# Patient Record
Sex: Male | Born: 1945 | Race: White | Hispanic: No | Marital: Married | Smoking: Former smoker
Health system: Southern US, Community
[De-identification: ages and names within clinical notes are randomized; demographics above are authoritative.]

## PROBLEM LIST (undated history)

## (undated) DIAGNOSIS — S065X9A Traumatic subdural hemorrhage with loss of consciousness of unspecified duration, initial encounter: Secondary | ICD-10-CM

## (undated) DIAGNOSIS — I10 Essential (primary) hypertension: Secondary | ICD-10-CM

## (undated) DIAGNOSIS — J449 Chronic obstructive pulmonary disease, unspecified: Secondary | ICD-10-CM

## (undated) DIAGNOSIS — J45909 Unspecified asthma, uncomplicated: Secondary | ICD-10-CM

## (undated) DIAGNOSIS — I82409 Acute embolism and thrombosis of unspecified deep veins of unspecified lower extremity: Secondary | ICD-10-CM

## (undated) DIAGNOSIS — S065XAA Traumatic subdural hemorrhage with loss of consciousness status unknown, initial encounter: Secondary | ICD-10-CM

## (undated) DIAGNOSIS — E119 Type 2 diabetes mellitus without complications: Secondary | ICD-10-CM

## (undated) DIAGNOSIS — I480 Paroxysmal atrial fibrillation: Secondary | ICD-10-CM

## (undated) HISTORY — PX: IVC FILTER PLACEMENT (ARMC HX): HXRAD1551

## (undated) HISTORY — PX: CORONARY STENT PLACEMENT: SHX1402

---

## 2006-06-13 ENCOUNTER — Emergency Department: Payer: Self-pay | Admitting: Emergency Medicine

## 2015-05-31 ENCOUNTER — Inpatient Hospital Stay
Admission: EM | Admit: 2015-05-31 | Discharge: 2015-06-05 | DRG: 193 | Disposition: A | Discharge status: Home Health | Payer: Non-veteran care | Attending: Internal Medicine | Admitting: Internal Medicine

## 2015-05-31 ENCOUNTER — Emergency Department: Payer: Non-veteran care

## 2015-05-31 ENCOUNTER — Encounter: Payer: Self-pay | Admitting: Emergency Medicine

## 2015-05-31 DIAGNOSIS — Z86718 Personal history of other venous thrombosis and embolism: Secondary | ICD-10-CM | POA: Diagnosis not present

## 2015-05-31 DIAGNOSIS — R700 Elevated erythrocyte sedimentation rate: Secondary | ICD-10-CM | POA: Diagnosis present

## 2015-05-31 DIAGNOSIS — D638 Anemia in other chronic diseases classified elsewhere: Secondary | ICD-10-CM | POA: Diagnosis present

## 2015-05-31 DIAGNOSIS — R55 Syncope and collapse: Secondary | ICD-10-CM | POA: Diagnosis present

## 2015-05-31 DIAGNOSIS — Z79899 Other long term (current) drug therapy: Secondary | ICD-10-CM

## 2015-05-31 DIAGNOSIS — E119 Type 2 diabetes mellitus without complications: Secondary | ICD-10-CM | POA: Diagnosis present

## 2015-05-31 DIAGNOSIS — Y95 Nosocomial condition: Secondary | ICD-10-CM | POA: Diagnosis present

## 2015-05-31 DIAGNOSIS — R410 Disorientation, unspecified: Secondary | ICD-10-CM

## 2015-05-31 DIAGNOSIS — G9340 Encephalopathy, unspecified: Secondary | ICD-10-CM | POA: Diagnosis present

## 2015-05-31 DIAGNOSIS — R609 Edema, unspecified: Secondary | ICD-10-CM

## 2015-05-31 DIAGNOSIS — I959 Hypotension, unspecified: Secondary | ICD-10-CM | POA: Diagnosis present

## 2015-05-31 DIAGNOSIS — D649 Anemia, unspecified: Secondary | ICD-10-CM | POA: Diagnosis present

## 2015-05-31 DIAGNOSIS — J440 Chronic obstructive pulmonary disease with acute lower respiratory infection: Secondary | ICD-10-CM | POA: Diagnosis present

## 2015-05-31 DIAGNOSIS — N179 Acute kidney failure, unspecified: Secondary | ICD-10-CM | POA: Diagnosis present

## 2015-05-31 DIAGNOSIS — S065X9A Traumatic subdural hemorrhage with loss of consciousness of unspecified duration, initial encounter: Secondary | ICD-10-CM | POA: Diagnosis present

## 2015-05-31 DIAGNOSIS — I10 Essential (primary) hypertension: Secondary | ICD-10-CM | POA: Diagnosis present

## 2015-05-31 DIAGNOSIS — Y92009 Unspecified place in unspecified non-institutional (private) residence as the place of occurrence of the external cause: Secondary | ICD-10-CM | POA: Diagnosis not present

## 2015-05-31 DIAGNOSIS — I82409 Acute embolism and thrombosis of unspecified deep veins of unspecified lower extremity: Secondary | ICD-10-CM | POA: Diagnosis not present

## 2015-05-31 DIAGNOSIS — I480 Paroxysmal atrial fibrillation: Secondary | ICD-10-CM | POA: Diagnosis present

## 2015-05-31 DIAGNOSIS — E860 Dehydration: Secondary | ICD-10-CM | POA: Diagnosis present

## 2015-05-31 DIAGNOSIS — Z87891 Personal history of nicotine dependence: Secondary | ICD-10-CM

## 2015-05-31 DIAGNOSIS — Z955 Presence of coronary angioplasty implant and graft: Secondary | ICD-10-CM | POA: Diagnosis not present

## 2015-05-31 DIAGNOSIS — J189 Pneumonia, unspecified organism: Secondary | ICD-10-CM | POA: Diagnosis not present

## 2015-05-31 DIAGNOSIS — W19XXXA Unspecified fall, initial encounter: Secondary | ICD-10-CM | POA: Diagnosis present

## 2015-05-31 HISTORY — DX: Type 2 diabetes mellitus without complications: E11.9

## 2015-05-31 HISTORY — DX: Paroxysmal atrial fibrillation: I48.0

## 2015-05-31 HISTORY — DX: Acute embolism and thrombosis of unspecified deep veins of unspecified lower extremity: I82.409

## 2015-05-31 HISTORY — DX: Traumatic subdural hemorrhage with loss of consciousness of unspecified duration, initial encounter: S06.5X9A

## 2015-05-31 HISTORY — DX: Chronic obstructive pulmonary disease, unspecified: J44.9

## 2015-05-31 HISTORY — DX: Essential (primary) hypertension: I10

## 2015-05-31 HISTORY — DX: Traumatic subdural hemorrhage with loss of consciousness status unknown, initial encounter: S06.5XAA

## 2015-05-31 LAB — URINALYSIS COMPLETE WITH MICROSCOPIC (ARMC ONLY)
Bilirubin Urine: NEGATIVE
GLUCOSE, UA: NEGATIVE mg/dL
HGB URINE DIPSTICK: NEGATIVE
Ketones, ur: NEGATIVE mg/dL
LEUKOCYTES UA: NEGATIVE
Nitrite: NEGATIVE
PH: 5 (ref 5.0–8.0)
PROTEIN: NEGATIVE mg/dL
SPECIFIC GRAVITY, URINE: 1.014 (ref 1.005–1.030)

## 2015-05-31 LAB — CBC
HEMATOCRIT: 26.4 % — AB (ref 40.0–52.0)
HEMOGLOBIN: 8.7 g/dL — AB (ref 13.0–18.0)
MCH: 29.9 pg (ref 26.0–34.0)
MCHC: 33.1 g/dL (ref 32.0–36.0)
MCV: 90.3 fL (ref 80.0–100.0)
Platelets: 469 10*3/uL — ABNORMAL HIGH (ref 150–440)
RBC: 2.92 MIL/uL — AB (ref 4.40–5.90)
RDW: 14.7 % — ABNORMAL HIGH (ref 11.5–14.5)
WBC: 16.5 10*3/uL — AB (ref 3.8–10.6)

## 2015-05-31 LAB — BASIC METABOLIC PANEL
Anion gap: 10 (ref 5–15)
BUN: 31 mg/dL — ABNORMAL HIGH (ref 6–20)
CHLORIDE: 104 mmol/L (ref 101–111)
CO2: 23 mmol/L (ref 22–32)
Calcium: 8.5 mg/dL — ABNORMAL LOW (ref 8.9–10.3)
Creatinine, Ser: 1.9 mg/dL — ABNORMAL HIGH (ref 0.61–1.24)
GFR calc non Af Amer: 35 mL/min — ABNORMAL LOW (ref >60)
GFR, EST AFRICAN AMERICAN: 40 mL/min — AB (ref >60)
Glucose, Bld: 112 mg/dL — ABNORMAL HIGH (ref 65–99)
POTASSIUM: 4.7 mmol/L (ref 3.5–5.1)
SODIUM: 137 mmol/L (ref 135–145)

## 2015-05-31 LAB — GLUCOSE, CAPILLARY: GLUCOSE-CAPILLARY: 112 mg/dL — AB (ref 65–99)

## 2015-05-31 MED ORDER — ACETAMINOPHEN 325 MG PO TABS: 650.0000 mg | ORAL_TABLET | Freq: Four times a day (QID) | ORAL | Status: DC | PRN

## 2015-05-31 MED ORDER — ONDANSETRON HCL 4 MG PO TABS: 4.0000 mg | ORAL_TABLET | Freq: Four times a day (QID) | ORAL | Status: DC | PRN

## 2015-05-31 MED ORDER — PIPERACILLIN-TAZOBACTAM 3.375 G IVPB
3.3750 g | Freq: Once | INTRAVENOUS | Status: AC
  Administered 2015-05-31: 3.375 g via INTRAVENOUS
  Filled 2015-05-31: qty 50

## 2015-05-31 MED ORDER — OXYCODONE HCL 5 MG PO TABS: 5.0000 mg | ORAL_TABLET | ORAL | Status: DC | PRN

## 2015-05-31 MED ORDER — SODIUM CHLORIDE 0.9 % IJ SOLN
3.0000 mL | Freq: Two times a day (BID) | INTRAMUSCULAR | Status: DC
  Administered 2015-06-01 – 2015-06-05 (×8): 3 mL via INTRAVENOUS

## 2015-05-31 MED ORDER — ACETAMINOPHEN 650 MG RE SUPP: 650.0000 mg | Freq: Four times a day (QID) | RECTAL | Status: DC | PRN

## 2015-05-31 MED ORDER — SODIUM CHLORIDE 0.9 % IV BOLUS (SEPSIS)
1000.0000 mL | Freq: Once | INTRAVENOUS | Status: AC
Start: 2015-05-31 — End: 2015-05-31
  Administered 2015-05-31: 1000 mL via INTRAVENOUS

## 2015-05-31 MED ORDER — SODIUM CHLORIDE 0.9 % IV SOLN
INTRAVENOUS | Status: DC
  Administered 2015-06-01 (×2): via INTRAVENOUS

## 2015-05-31 MED ORDER — VANCOMYCIN HCL IN DEXTROSE 1-5 GM/200ML-% IV SOLN
1000.0000 mg | Freq: Once | INTRAVENOUS | Status: AC
Start: 2015-05-31 — End: 2015-06-04
  Administered 2015-05-31: 1000 mg via INTRAVENOUS
  Filled 2015-05-31: qty 200

## 2015-05-31 MED ORDER — MORPHINE SULFATE (PF) 2 MG/ML IV SOLN
2.0000 mg | INTRAVENOUS | Status: DC | PRN
  Administered 2015-06-01: 2 mg via INTRAVENOUS
  Filled 2015-05-31 (×2): qty 1

## 2015-05-31 MED ORDER — ONDANSETRON HCL 4 MG/2ML IJ SOLN: 4.0000 mg | Freq: Four times a day (QID) | INTRAMUSCULAR | Status: DC | PRN

## 2015-05-31 NOTE — ED Notes (Signed)
Pt arrived by EMS after being picked up from home. EMS states pt was at [facility] today for stent [facility] and was not "happy" with the medicines he was receiving and signed out AMA. EMS stated that upon arrival home pt got out of the car and lost consciousness and hit his head and defecated on himself. EMS also stated that pt was to be evaluated for possible stroke and it was not confirmed but did notice that his stool was dark. Pt has hx of COPD, HTN, TIAs and diabetes. Upon EMS arrival  BP was 78/40. Pt was given 500cc bolus by EMS and upon reassessment BP was 113/47.

## 2015-05-31 NOTE — H&P (Signed)
[facility] [place] [facility]   PATIENT NAME: Thomas [name]    MR#:  [id number]  DATE OF BIRTH:  07/19/1945   DATE OF ADMISSION:  05/31/2015  PRIMARY CARE PHYSICIAN: No PCP Per Patient   REQUESTING/REFERRING PHYSICIAN: Lord  CHIEF COMPLAINT:   Chief Complaint  Patient presents with  . Loss of Consciousness    HISTORY OF PRESENT ILLNESS:  Thomas [name]  is a 69 y.o. male with a known history of recent DVT status post IVC filter placements, who is presenting after syncopal episode. The patient is unable to provide meaningful information given mental status/medical condition majority of history Obtained from ED provider as well as prior documentation. Patient recently finished an extensive course in [facility] admitted 05/12/2015 after her fall course complicated by aspiration pneumonia, he required intubation/mechanical ventilation-completed full course of antibiotics. He is also found to have a DVT, subdural hematoma, an IVC filter placed. He was transferred out of the intensive care 05/30/2015 where he was noted to have altered mental status described as confusion. Despite this the patient and family decided to leave AGAINST MEDICAL ADVICE 05/31/2015. While at home at syncopal episode from standing with head trauma. On EMS arrival noted to be hypotensive systolic blood pressure in the 70s. When asked the patient how he is doing he states "I'm a human being and pretty happy about that" followed by incoherent babble.  PAST MEDICAL HISTORY:   Past Medical History  Diagnosis Date  . COPD (chronic obstructive pulmonary disease) (HCC)   . Diabetes mellitus without complication (HCC)   . Hypertension   . DVT (deep venous thrombosis) (HCC)   . Subdural hematoma (HCC)     PAST SURGICAL HISTORY:   Past Surgical History  Procedure Laterality Date  . Coronary stent placement    . Ivc filter placement ([facility] hx)      SOCIAL HISTORY:   Social History   Substance Use Topics  . Smoking status: Former Games developer  . Smokeless tobacco: Not on file  . Alcohol Use: No    FAMILY HISTORY:   Family History  Problem Relation Age of Onset  . Hypertension Other     DRUG ALLERGIES:  No Known Allergies  REVIEW OF SYSTEMS:  Unable to obtain given mental status/medical condition   MEDICATIONS AT HOME:   Prior to Admission medications   Medication Sig Start Date End Date Taking? Authorizing Provider  albuterol (PROVENTIL HFA;VENTOLIN HFA) 108 (90 BASE) MCG/ACT inhaler Inhale 2 puffs into the lungs every 6 (six) hours as needed for wheezing or shortness of breath.   Yes Historical Provider, MD  carvedilol (COREG) 6.25 MG tablet Take 6.25 mg by mouth 2 (two) times daily.   Yes Historical Provider, MD  diltiazem (CARDIZEM CD) 360 MG 24 hr capsule Take 360 mg by mouth daily.   Yes Historical Provider, MD  gabapentin (NEURONTIN) 300 MG capsule Take 300 mg by mouth 3 (three) times daily.   Yes Historical Provider, MD  Melatonin 3 MG TABS Take 3 mg by mouth at bedtime as needed (for sleep).   Yes Historical Provider, MD  Multiple Vitamin (MULTIVITAMIN WITH MINERALS) TABS tablet Take 1 tablet by mouth daily.   Yes Historical Provider, MD  niacin 100 MG tablet Take 300 mg by mouth 3 (three) times daily with meals.   Yes Historical Provider, MD  omega-3 acid ethyl esters (LOVAZA) 1 G capsule Take 2 g by mouth 2 (two) times daily.   Yes Historical Provider,  MD  potassium chloride SA (K-DUR,KLOR-CON) 20 MEQ tablet Take 20 mEq by mouth daily.   Yes Historical Provider, MD  pravastatin (PRAVACHOL) 40 MG tablet Take 40 mg by mouth at bedtime.   Yes Historical Provider, MD  Pyridoxine HCl (VITAMIN B-6) 500 MG tablet Take 1,000 mg by mouth daily.   Yes Historical Provider, MD  thiamine (VITAMIN B-1) 100 MG tablet Take 100 mg by mouth daily.   Yes Historical Provider, MD  vitamin B-12 (CYANOCOBALAMIN) 1000 MCG tablet Take 1,000 mcg by mouth daily.   Yes Historical  Provider, MD  Vitamin D-Vitamin K (VITAMIN K2-VITAMIN D3) 45-2000 MCG-UNIT CAPS Take 1 capsule by mouth daily.   Yes Historical Provider, MD      VITAL SIGNS:  Blood pressure 127/60, pulse 74, temperature 97.5 F (36.4 C), temperature source Oral, resp. rate 16, SpO2 94 %.  PHYSICAL EXAMINATION:  VITAL SIGNS: Filed Vitals:   05/31/15 1930  BP: 127/60  Pulse:   Temp:   Resp: 16   GENERAL:68 y.o.male currently in moderate acute distress given mental status.  HEAD: Normocephalic, atraumatic.  EYES: Pupils equal, round, reactive to light. Extraocular muscles intact. No scleral icterus.  MOUTH: Moist mucosal membrane. Dentition intact. No abscess noted.  EAR, NOSE, THROAT: Clear without exudates. No external lesions.  NECK: Supple. No thyromegaly. No nodules. No JVD.  PULMONARY: Diffuse coarse breath sounds with scattered rhonchi  without wheeze . No use of accessory muscles, Good respiratory effort. good air entry bilaterally CHEST: Nontender to palpation.  CARDIOVASCULAR: S1 and S2. Regular rate and rhythm. No murmurs, rubs, or gallops. No edema. Pedal pulses 2+ bilaterally.  GASTROINTESTINAL: Soft, nontender, nondistended. No masses. Positive bowel sounds. No hepatosplenomegaly.  MUSCULOSKELETAL: No swelling, clubbing, or edema. Range of motion full in all extremities.  NEUROLOGIC: Difficult to fully assess given patient's mental status Cranial nerves II through XII are intact. No gross focal neurological deficits. Sensation intact. Reflexes intact.  SKIN: No ulceration, lesions, rashes, or cyanosis. Skin warm and dry. Turgor intact.  PSYCHIATRIC: Mood, affect blunted. The patient is awake, alert and oriented x self. Insight, judgment poor.    LABORATORY PANEL:   CBC  Recent Labs Lab 05/31/15 1748  WBC 16.5*  HGB 8.7*  HCT 26.4*  PLT 469*   ------------------------------------------------------------------------------------------------------------------  Chemistries    Recent Labs Lab 05/31/15 1748  NA 137  K 4.7  CL 104  CO2 23  GLUCOSE 112*  BUN 31*  CREATININE 1.90*  CALCIUM 8.5*   ------------------------------------------------------------------------------------------------------------------  Cardiac Enzymes No results for input(s): TROPONINI in the last 168 hours. ------------------------------------------------------------------------------------------------------------------  RADIOLOGY:  Ct Head Wo Contrast  05/31/2015  CLINICAL DATA:  Pt arrived by EMS after being picked up from home. EMS states [facility] and was not "happy" with the medicines he was receiving and signed out AMA. EMS stated that upon arrival home pt got out of the.*comment was truncated* EXAM: CT HEAD WITHOUT CONTRAST TECHNIQUE: Contiguous axial images were obtained from the base of the skull through the vertex without intravenous contrast. COMPARISON:  None FINDINGS: Atherosclerotic and physiologic intracranial calcifications. Mild atrophy. There is no evidence of acute intracranial hemorrhage, brain edema, mass lesion, acute infarction, mass effect, or midline shift. Acute infarct may be inapparent on noncontrast CT. No other intra-axial abnormalities are seen, and the ventricles and sulci are within normal limits in size and symmetry. No abnormal extra-axial fluid collections or masses are identified. No significant calvarial abnormality. IMPRESSION: 1. Negative for  bleed or other acute intracranial process. Electronically Signed   By: Corlis Leak M.D.   On: 05/31/2015 19:30   Dg Chest Port 1 View  05/31/2015  CLINICAL DATA:  Patient history of bronchitis and pneumonia. Syncopal episode. EXAM: PORTABLE CHEST 1 VIEW COMPARISON:  None. FINDINGS: Limited portable chest radiograph. Enlarged cardiac and mediastinal contours. Heterogeneous opacities within the lower lungs bilaterally. Linear opacity right mid lung. No large pleural effusion or  pneumothorax. IMPRESSION: Limited exam due to portable technique. Heterogeneous opacities within the lung bases bilaterally favored to represent atelectasis. Linear opacity within the right midlung is nonspecific and represent atelectasis or scarring. Recommend comparison with outside prior exams or repeat PA and lateral chest radiograph for further evaluation. Electronically Signed   By: Annia Belt M.D.   On: 05/31/2015 21:30    EKG:   Orders placed or performed during the hospital encounter of 05/31/15  . ED EKG  . ED EKG  . EKG 12-Lead  . EKG 12-Lead    IMPRESSION AND PLAN:   68 year old Caucasian gentleman history of COPD unspecified reason extended course at [facility] requiring intubation secondary to aspiration pneumonia, also noted subdural hematoma and DVT who AMA from [facility] just today who is presenting after his syncopal episode.  1. Syncope: Telemetry, IV fluid hydration, troponin 2. Healthcare associated pneumonia: As indicated by worsening leukocytosis, chest x-ray findings somewhat worse than [facility] findings already received antibiotics vancomycin/Zosyn and continue with his follow culture data breathing treatments and oxygen. 3. Acute kidney injury: Worsening renal function since last checked [facility] IV fluid hydration and follow renal function/urinary output. Of note had acute kidney injury on last hospitalization creatinine maximum at 3 however improved prior to AMA 4. Essential hypertension: Coreg 5. Paroxysmal atrial fibrillation: Continue with Cardizem hold on anticoagulation given recent subdural hematoma 6. Venous thromboembolism prophylactic: SCDs    All the records are reviewed and case discussed with ED provider. Management plans discussed with the patient, family and they are in agreement.  CODE STATUS: Full  TOTAL TIME TAKING CARE OF THIS PATIENT: 55 minutes.    Magdala Brahmbhatt,  Mardi Mainland.D on 05/31/2015 at 11:49 PM  Between 7am to 6pm - Pager - [phone]  After 6pm: House  Pager: - [phone]  [facility]  Office  [phone]  CC: Primary care physician; No PCP Per Patient

## 2015-05-31 NOTE — ED Notes (Signed)
Family at bedside. Pt is alert but not making complete sense when he talks.

## 2015-05-31 NOTE — ED Provider Notes (Signed)
[facility] Emergency Department Provider Note   ____________________________________________  Time seen:  I have reviewed the triage vital signs and the triage nursing note.  HISTORY  Chief Complaint Loss of Consciousness   Historian Limited,Patientpoor historian History per daughter and wife once they arrived  HPI Thomas [name] is a 69 y.o. male who was recently in the hospital at [facility] for respiratory failure, status post intubation/extubation, status post atrial fibrillation with rapid ventricular response, found to have a subdural hematoma on xarelto (which was stopped and IVC filter placed).  Also had generalized deconditioning, intermittent delirium throughout the hospital stay.  He left  [facility] today AGAINST MEDICAL ADVICE. Patient states that "they didn't have my best interest in mind "and that they were just doing procedures to "make money." Patient states he was ready to go home. He states he had all medicines he needed at home.  At home patient states he felt lightheaded and passed out and struck his head. Reportedly per EMS the patient's initial blood pressure was low at 78/40 and improved after 500 cc bolus by EMS. Patient is currently not complaining of any pain including no headache, neck pain, chest pain, extremity pain, or abdominal pain.  Family states they thought patient might be low to do okay at home, but he can't walk due to weakness, and he has altered mental status. He has been paranoid.    Past Medical History  Diagnosis Date  . COPD (chronic obstructive pulmonary disease) (HCC)   . Diabetes mellitus without complication (HCC)   . Hypertension     There are no active problems to display for this patient.   Past Surgical History  Procedure Laterality Date  . Coronary stent placement      No current outpatient prescriptions on file.  Allergies Review of patient's allergies indicates no known allergies.  History reviewed. No  pertinent family history.  Social History Social History  Substance Use Topics  . Smoking status: Former Games developer  . Smokeless tobacco: None  . Alcohol Use: No    Review of Systems  Constitutional: Negative for fever. Eyes: Chronic left eyelid droop.. ENT: Negative for sore throat. Cardiovascular: Negative for chest pain. Respiratory: Negative for shortness of breath. Gastrointestinal: Negative for abdominal pain, vomiting and diarrhea. Genitourinary: Negative for dysuria. Musculoskeletal: Negative for back pain. Skin: Negative for rash. Neurological: Negative for headache. 10 point Review of Systems otherwise negative ____________________________________________   PHYSICAL EXAM:  VITAL SIGNS: ED Triage Vitals  Enc Vitals Group     BP 05/31/15 1715 102/79 mmHg     Pulse Rate 05/31/15 1715 75     Resp 05/31/15 1739 23     Temp 05/31/15 1739 97.5 F (36.4 C)     Temp Source 05/31/15 1739 Oral     SpO2 05/31/15 1739 97 %     Weight --      Height --      Head Cir --      Peak Flow --      Pain Score 05/31/15 1738 0     Pain Loc --      Pain Edu? --      Excl. in GC? --      Constitutional: Alert and disoriented. Some stuttering with his speech. In no acute distress. Eyes: Conjunctivae are normal. PERRL. left eyelid lid lag ENT   Head: Normocephalic and atraumatic.   Nose: No congestion/rhinnorhea.   Mouth/Throat: Mucous membranes are mildly dry.   Neck: No stridor.  No midline C-spine tenderness to palpation. Cardiovascular/Chest: Normal rate, regular rhythm.  No murmurs, rubs, or gallops. Respiratory: Normal respiratory effort without tachypnea nor retractions. Breath sounds are clear and equal bilaterally. No wheezes/rales/rhonchi. Gastrointestinal: Soft. No distention, no guarding, no rebound. Nontender. Obese.  Genitourinary/rectal:Deferred Musculoskeletal: Nontender with normal range of motion in all extremities. No joint effusions.  No lower  extremity tenderness.  Mild lower extremity edema bilaterally. Neurologic:  Some stuttering with his speech. Occasional interchanging incorrect word. No focal weakness or numbness. Oriented 2.somewhat paranoid about hospital "just doing tested get my money" Skin:  Skin is warm, dry and intact. No rash noted.   ____________________________________________   EKG I, Governor Rooksebecca Renea Schoonmaker, MD, the attending physician have personally viewed and interpreted all ECGs.  73 bpm. Normal sinus rhythm. Narrow QRS. Normal axis. Nonspecific T wave. ____________________________________________  LABS (pertinent positives/negatives)  Urinalysis negative for ketones, urinary tract infection, blood Basic metabolic panel significant for BUN 31 and creatinine 1.90 White blood cell count 16.5, hemoglobin 8.7 and platelet count 469  ____________________________________________  RADIOLOGY All Xrays were viewed by me. Imaging interpreted by Radiologist.  CT head noncontrast: negative for bleed or other acute intracranial process  Chest x-ray portable:  IMPRESSION: Limited exam due to portable technique.  Heterogeneous opacities within the lung bases bilaterally favored to represent atelectasis.  Linear opacity within the right midlung is nonspecific and represent atelectasis or scarring.  Recommend comparison with outside prior exams or repeat PA and lateral chest radiograph for further evaluation. __________________________________________  PROCEDURES  Procedure(s) performed: None  Critical Care performed: None  ____________________________________________   ED COURSE / ASSESSMENT AND PLAN  CONSULTATIONS: hospitalist for admission  Pertinent labs & imaging results that were available during my care of the patient were reviewed by me and considered in my medical decision making (see chart for details).  [facility] summary and note laying out his  concern about the patient leaving AGAINST MEDICAL ADVICE due to delirium, generalized deconditioning and fall risk.  Once family arrived they confirm the patient has not had his mental status baseline. They did try taking him home, but he is not listening, is not redirectable, is unsafe walking into weak to walk. They feel like he does need admission to rehabilitation.  His white blood count was found to be elevated, and this is up from yesterday's labs which were sent by fax, from 10 to today's 16. This prompted me to add a chest x-ray, and on my visualization it looks like he has basilar infiltrates. This may be Hospital/healthcare acquired pneumonia, patient will be treated for appropriate coverage including vancomycin and Zosyn after blood cultures.   Patient / Family / Caregiver informed of clinical course, medical decision-making process, and agree with plan.   ___________________________________________   FINAL CLINICAL IMPRESSION(S) / ED DIAGNOSES   Final diagnoses:  Syncope, unspecified syncope type  Dehydration  Acute renal failure, unspecified acute renal failure type (HCC)  Bilateral pneumonia       Governor Rooksebecca Paxtyn Boyar, MD 05/31/15 2205

## 2015-06-01 LAB — TROPONIN I
Troponin I: <0.03 ng/mL (ref <0.031)
Troponin I: <0.03 ng/mL (ref <0.031)

## 2015-06-01 LAB — VANCOMYCIN, RANDOM

## 2015-06-01 MED ORDER — PRAVASTATIN SODIUM 40 MG PO TABS
40.0000 mg | ORAL_TABLET | Freq: Every day | ORAL | Status: DC
  Administered 2015-06-01 – 2015-06-04 (×4): 40 mg via ORAL
  Filled 2015-06-01 (×4): qty 1

## 2015-06-01 MED ORDER — VITAMIN B-6 100 MG PO TABS: 1000.0000 mg | ORAL_TABLET | Freq: Every day | ORAL | Status: DC

## 2015-06-01 MED ORDER — PIPERACILLIN-TAZOBACTAM 4.5 G IVPB
4.5000 g | Freq: Three times a day (TID) | INTRAVENOUS | Status: DC
Start: 2015-06-01 — End: 2015-06-04
  Administered 2015-06-01 – 2015-06-04 (×10): 4.5 g via INTRAVENOUS
  Filled 2015-06-01 (×14): qty 100

## 2015-06-01 MED ORDER — NIACIN 500 MG PO TABS
250.0000 mg | ORAL_TABLET | Freq: Three times a day (TID) | ORAL | Status: DC
  Administered 2015-06-01 – 2015-06-04 (×9): 250 mg via ORAL
  Administered 2015-06-04: 14:00:00 via ORAL
  Administered 2015-06-05: 09:00:00 250 mg via ORAL
  Filled 2015-06-01 (×16): qty 1

## 2015-06-01 MED ORDER — VITAMIN K2-VITAMIN D3 45-2000 MCG-UNIT PO CAPS: 1.0000 | ORAL_CAPSULE | Freq: Every day | ORAL | Status: DC

## 2015-06-01 MED ORDER — NIACIN 50 MG PO TABS: 300.0000 mg | ORAL_TABLET | Freq: Three times a day (TID) | ORAL | Status: DC

## 2015-06-01 MED ORDER — CARVEDILOL 3.125 MG PO TABS
6.2500 mg | ORAL_TABLET | Freq: Two times a day (BID) | ORAL | Status: DC
  Administered 2015-06-01 – 2015-06-05 (×9): 6.25 mg via ORAL
  Filled 2015-06-01 (×3): qty 1
  Filled 2015-06-01: qty 2
  Filled 2015-06-01 (×4): qty 1
  Filled 2015-06-01 (×2): qty 2

## 2015-06-01 MED ORDER — VITAMIN D 1000 UNITS PO TABS
2000.0000 [IU] | ORAL_TABLET | Freq: Every day | ORAL | Status: DC
  Administered 2015-06-01 – 2015-06-05 (×5): 2000 [IU] via ORAL
  Filled 2015-06-01 (×5): qty 2

## 2015-06-01 MED ORDER — VITAMIN B-1 100 MG PO TABS
100.0000 mg | ORAL_TABLET | Freq: Every day | ORAL | Status: DC
  Administered 2015-06-01 – 2015-06-02 (×3): 100 mg via ORAL
  Filled 2015-06-01 (×2): qty 1

## 2015-06-01 MED ORDER — IPRATROPIUM-ALBUTEROL 0.5-2.5 (3) MG/3ML IN SOLN: 3.0000 mL | RESPIRATORY_TRACT | Status: DC | PRN

## 2015-06-01 MED ORDER — GABAPENTIN 300 MG PO CAPS
300.0000 mg | ORAL_CAPSULE | Freq: Three times a day (TID) | ORAL | Status: DC
  Administered 2015-06-01 – 2015-06-05 (×13): 300 mg via ORAL
  Filled 2015-06-01 (×13): qty 1

## 2015-06-01 MED ORDER — OMEGA-3-ACID ETHYL ESTERS 1 G PO CAPS
2.0000 g | ORAL_CAPSULE | Freq: Two times a day (BID) | ORAL | Status: DC
  Administered 2015-06-01 – 2015-06-05 (×9): 2 g via ORAL
  Filled 2015-06-01 (×9): qty 2

## 2015-06-01 MED ORDER — ADULT MULTIVITAMIN W/MINERALS CH
1.0000 | ORAL_TABLET | Freq: Every day | ORAL | Status: DC
  Administered 2015-06-01 – 2015-06-05 (×5): 1 via ORAL
  Filled 2015-06-01 (×5): qty 1

## 2015-06-01 MED ORDER — SODIUM CHLORIDE 0.9 % IV SOLN
1500.0000 mg | INTRAVENOUS | Status: DC
  Administered 2015-06-01 – 2015-06-03 (×4): 1500 mg via INTRAVENOUS
  Filled 2015-06-01 (×5): qty 1500

## 2015-06-01 MED ORDER — VITAMIN B-12 1000 MCG PO TABS
1000.0000 ug | ORAL_TABLET | Freq: Every day | ORAL | Status: DC
  Administered 2015-06-01 – 2015-06-05 (×5): 1000 ug via ORAL
  Filled 2015-06-01 (×5): qty 1

## 2015-06-01 MED ORDER — DILTIAZEM HCL ER COATED BEADS 180 MG PO CP24
360.0000 mg | ORAL_CAPSULE | Freq: Every day | ORAL | Status: DC
  Administered 2015-06-01: 180 mg via ORAL
  Administered 2015-06-02 – 2015-06-05 (×3): 360 mg via ORAL
  Filled 2015-06-01 (×5): qty 2

## 2015-06-01 NOTE — Progress Notes (Signed)
Pt was transferred to room 251. Pt is settled in bed resting comfortably.

## 2015-06-01 NOTE — Progress Notes (Signed)
[facility] - [place] at [facility]   PATIENT NAME: Thomas [name]    MR#:  [id number]  DATE OF BIRTH:  25-Feb-1946  SUBJECTIVE:  CHIEF COMPLAINT:   Chief Complaint  Patient presents with  . Loss of Consciousness   Alert. Will not answer any questions regarding orientation. Denies any symptoms.  REVIEW OF SYSTEMS:   ROS unable to obtain as patient will not answer any questions  DRUG ALLERGIES:  No Known Allergies  VITALS:  Blood pressure 113/56, pulse 65, temperature 97.7 F (36.5 C), temperature source Oral, resp. rate 20, height  (1.854 m), weight 124.467 kg (274 lb 6.4 oz), SpO2 98 %.  PHYSICAL EXAMINATION:  GENERAL:  69 y.o.-year-old patient lying in the bed with no acute distress.  EYES: Pupils equal, round, reactive to light and accommodation. No scleral icterus. Extraocular muscles intact.  HEENT: Head atraumatic, normocephalic. Oropharynx and nasopharynx clear.  NECK:  Supple, no jugular venous distention. No thyroid enlargement, no tenderness.  LUNGS: Normal breath sounds bilaterally, no wheezing, rales,rhonchi or crepitation. No use of accessory muscles of respiration.  CARDIOVASCULAR: S1, S2 normal. No murmurs, rubs, or gallops.  ABDOMEN: Soft, nontender, distended. Bowel sounds present. No organomegaly or mass.  EXTREMITIES: No pedal edema, cyanosis, or clubbing.  NEUROLOGIC: Left-sided facial droop with twitching, moves all 4 extremities spontaneously, speech is normal though nonsensical, will not participate in neurologic examination PSYCHIATRIC: The patient is alert, will not answer questions states that he does not want to SKIN: Abrasion over the scalp, left hand, left arm   LABORATORY PANEL:   CBC  Recent Labs Lab 05/31/15 1748  WBC 16.5*  HGB 8.7*  HCT 26.4*  PLT 469*   ------------------------------------------------------------------------------------------------------------------  Chemistries   Recent Labs Lab  05/31/15 1748  NA 137  K 4.7  CL 104  CO2 23  GLUCOSE 112*  BUN 31*  CREATININE 1.90*  CALCIUM 8.5*   ------------------------------------------------------------------------------------------------------------------  Cardiac Enzymes  Recent Labs Lab 06/01/15 1336  TROPONINI <0.03   ------------------------------------------------------------------------------------------------------------------  RADIOLOGY:  Ct Head Wo Contrast  05/31/2015  CLINICAL DATA:  Pt arrived by EMS after being picked up from home. EMS states pt was at [facility] today for stent placement and was not "happy" with the medicines he was receiving and signed out AMA. EMS stated that upon arrival home pt got out of the.*comment was truncated* EXAM: CT HEAD WITHOUT CONTRAST TECHNIQUE: Contiguous axial images were obtained from the base of the skull through the vertex without intravenous contrast. COMPARISON:  None FINDINGS: Atherosclerotic and physiologic intracranial calcifications. Mild atrophy. There is no evidence of acute intracranial hemorrhage, brain edema, mass lesion, acute infarction, mass effect, or midline shift. Acute infarct may be inapparent on noncontrast CT. No other intra-axial abnormalities are seen, and the ventricles and sulci are within normal limits in size and symmetry. No abnormal extra-axial fluid collections or masses are identified. No significant calvarial abnormality. IMPRESSION: 1. Negative for bleed or other acute intracranial process. Electronically Signed   By: Corlis Leak M.D.   On: 05/31/2015 19:30   Dg Chest Port 1 View  05/31/2015  CLINICAL DATA:  Patient history of bronchitis and pneumonia. Syncopal episode. EXAM: PORTABLE CHEST 1 VIEW COMPARISON:  None. FINDINGS: Limited portable chest radiograph. Enlarged cardiac and mediastinal contours. Heterogeneous opacities within the lower lungs bilaterally. Linear opacity right mid lung. No large pleural effusion or pneumothorax. IMPRESSION:  Limited exam due to portable technique. Heterogeneous opacities within the lung bases bilaterally favored to represent atelectasis.  Linear opacity within the right midlung is nonspecific and represent atelectasis or scarring. Recommend comparison with outside prior exams or repeat PA and lateral chest radiograph for further evaluation. Electronically Signed   By: Annia Beltrew  Davis M.D.   On: 05/31/2015 21:30    EKG:   Orders placed or performed during the hospital encounter of 05/31/15  . ED EKG  . ED EKG  . EKG 12-Lead  . EKG 12-Lead    ASSESSMENT AND PLAN:    1. Syncope: Likely related to pneumonia. Troponins negative. No evidence on telemetry. Continue hydration.  2. Healthcare associated pneumonia: [facility]. Denies shortness of breath or cough. Lung exam is normal. Chest x-ray shows possible linear opacity in the right midlung and heterogeneous opacities within the lung bases. Poor quality exam. Blood cultures negative so far. No fevers. Does have a leukocytosis. For now continue with vancomycin and Zosyn.  3. Acute kidney injury: Worsening renal function since last checked [facility] IV fluid hydration and follow renal function/urinary output. Of note had acute kidney injury on last hospitalization creatinine maximum at 3 however [facility] Will recheck creatinine in the morning.  4. Essential hypertension: Coreg. Monitor closely blood pressure has been slightly low.  5. Paroxysmal atrial fibrillation: Continue with Cardizem hold on anticoagulation given recent subdural hematoma  6. Venous thromboembolism prophylactic: SCDs  7 altered mental status: I have attempted to contact his wife was unsuccessful today. I do not know his baseline. He is alert. He will not answer questions of orientation. Seems paranoid. Possible metabolic encephalopathy in the setting of infection. Possible progression of chronic underlying disorder.  All the records are reviewed  and case discussed with Care Management/Social Workerr. Management plans discussed with the patient, family and they are in agreement.  CODE STATUS: Full  TOTAL TIME TAKING CARE OF THIS PATIENT: 35 minutes.  Greater than 50% of time spent in care coordination and counseling. POSSIBLE D/C IN 2 DAYS, DEPENDING ON CLINICAL CONDITION.   Elby ShowersWALSH, Aws Shere M.D on 06/01/2015 at 2:49 PM  Between 7am to 6pm - Pager - [phone]  After 6pm go to [email or web address] - password [email or web address] [facility]  [facility]  Office  [phone]  CC: Primary care physician; No PCP Per Patient

## 2015-06-01 NOTE — Progress Notes (Signed)
ANTIBIOTIC CONSULT NOTE - INITIAL  Pharmacy Consult for Vancomycin and Zosyn dosing Indication: HCAP  No Known Allergies  Patient Measurements: Height: 6\' 1"  (185.4 cm) Weight: 274 lb 6.4 oz (124.467 kg) IBW/kg (Calculated) : 79.9 Adjusted Body Weight: 98kg  Vital Signs: Temp: 98 F (36.7 C) (11/19 0052) Temp Source: Oral (11/19 0052) BP: 113/59 mmHg (11/19 0052) Pulse Rate: 72 (11/19 0052) Intake/Output from previous day:   Intake/Output from this shift:    Labs:  Recent Labs  05/31/15 1748  WBC 16.5*  HGB 8.7*  PLT 469*  CREATININE 1.90*   Estimated Creatinine Clearance: 51.4 mL/min (by C-G formula based on Cr of 1.9).  Recent Labs  05/31/15 1748  [facility]     Microbiology: No results found for this or any previous visit (from the past 720 hour(s)).  Medical History: Past Medical History  Diagnosis Date  . COPD (chronic obstructive pulmonary disease) (HCC)   . Diabetes mellitus without complication (HCC)   . Hypertension   . DVT (deep venous thrombosis) (HCC)   . Subdural hematoma (HCC)   . Paroxysmal atrial fibrillation (HCC)     Medications:   Assessment: Blood cx pending UA: (-) CXR: heterogeneous opacities  Goal of Therapy:  Vancomycin trough level 15-20 mcg/ml  Plan:  Vancomycin TBW 124.5kg  IBW 79.9kg  DW 98kg  Vd 69L kei 0.048 hr-1  t1/2 14 hours 1500 mg q 18 hours ordered with stacked dosing. Level before 5th dose.  Zosyn 4.5 grams q 8 hours ordered for Pseudomonas risk of previous abx usage.  Fulton ReekMatt Eather Chaires, PharmD, BCPS  06/01/2015

## 2015-06-01 NOTE — Progress Notes (Signed)
PT Cancellation Note  Patient Details Name: Lovie CholJames M Callegari MRN: [id number] DOB: 02/16/1946   Cancelled Treatment:    Reason Eval/Treat Not Completed: Patient's level of consciousness PT evaluation attempted. Pt was not coherent enough to participate in PT eval today. Pt unable to follow commands >25% of the time. PT phoned nursing to make them aware.  Carlyon ShadowAshley C. Nayara Taplin, PT, DPT [phone]  Kameisha Malicki 06/01/2015, 12:54 PM

## 2015-06-01 NOTE — Progress Notes (Signed)
Pt was admitted into 256. Pt alert, but confused. Oriented to room and staff.  Skin checked with Tiffany. Assessment completed. Vital signs stable. Pt put on telemetry box 12, box verified. Safety precautions initiated, bed alarm is on. Patient resting in bed. Will continue to monitor.   Thomas [name], Shlomie Romig M

## 2015-06-02 LAB — CBC
HCT: 23.6 % — ABNORMAL LOW (ref 40.0–52.0)
HEMOGLOBIN: 7.6 g/dL — AB (ref 13.0–18.0)
MCH: 29.1 pg (ref 26.0–34.0)
MCHC: 32.1 g/dL (ref 32.0–36.0)
MCV: 90.6 fL (ref 80.0–100.0)
Platelets: 323 10*3/uL (ref 150–440)
RBC: 2.61 MIL/uL — AB (ref 4.40–5.90)
RDW: 14.8 % — ABNORMAL HIGH (ref 11.5–14.5)
WBC: 10.4 10*3/uL (ref 3.8–10.6)

## 2015-06-02 LAB — RETICULOCYTES
RBC.: 2.93 MIL/uL — ABNORMAL LOW (ref 4.40–5.90)
RETIC COUNT ABSOLUTE: 140.6 10*3/uL (ref 19.0–183.0)
RETIC CT PCT: 4.8 % — AB (ref 0.4–3.1)

## 2015-06-02 LAB — BASIC METABOLIC PANEL
Anion gap: 6 (ref 5–15)
BUN: 25 mg/dL — ABNORMAL HIGH (ref 6–20)
CHLORIDE: 108 mmol/L (ref 101–111)
CO2: 24 mmol/L (ref 22–32)
CREATININE: 1.48 mg/dL — AB (ref 0.61–1.24)
Calcium: 8.1 mg/dL — ABNORMAL LOW (ref 8.9–10.3)
GFR calc non Af Amer: 47 mL/min — ABNORMAL LOW (ref >60)
GFR, EST AFRICAN AMERICAN: 54 mL/min — AB (ref >60)
Glucose, Bld: 109 mg/dL — ABNORMAL HIGH (ref 65–99)
Potassium: 3.9 mmol/L (ref 3.5–5.1)
SODIUM: 138 mmol/L (ref 135–145)

## 2015-06-02 LAB — SEDIMENTATION RATE: Sed Rate: >140 mm/hr — ABNORMAL HIGH (ref 0–20)

## 2015-06-02 LAB — FOLATE: FOLATE: 30 ng/mL (ref >5.9)

## 2015-06-02 LAB — IRON AND TIBC
IRON: 106 ug/dL (ref 45–182)
Saturation Ratios: 34 % (ref 17.9–39.5)
TIBC: 311 ug/dL (ref 250–450)
UIBC: 205 ug/dL

## 2015-06-02 LAB — FERRITIN: FERRITIN: 358 ng/mL — AB (ref 24–336)

## 2015-06-02 LAB — VITAMIN B12: VITAMIN B 12: 1226 pg/mL — AB (ref 180–914)

## 2015-06-02 LAB — TSH: TSH: 4.398 u[IU]/mL (ref 0.350–4.500)

## 2015-06-02 MED ORDER — THIAMINE HCL 100 MG/ML IJ SOLN
100.0000 mg | Freq: Every day | INTRAMUSCULAR | Status: DC
  Administered 2015-06-02 – 2015-06-05 (×4): 100 mg via INTRAVENOUS
  Filled 2015-06-02 (×4): qty 2

## 2015-06-02 NOTE — Consult Note (Signed)
  Pt seen in Consult in Room No 251. S Pt is a 69 yr old white male who is a good historian. 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. Pt is married for 33 yrs and lives with his wife. [facility]" I am sick." H/O Present Illness  - Pt reports that he was close to house and thought he can make it out of car and fell down. Pt was asked to be seen for confusion and "talking and being out of it." Pt is being treated for Pneumonia with anti-biotics. Past psych history. No previous H/O MI and never been admitted to Inpt psychiatry No H/O suicide attempts. Alcohol and drugs denied. M.S.  Pt appears calm pleasant and co-operative. No agitation . Affect is neutral and mood is stable and denies feeling depressed. No psychosis and denies av hallucinations or delusional or paranoid ideas. Regarding for Judgement for" Fire he said he would leave." Denies s/h ideas or plans. I/J fair and adequate. Imp Confusion secondary to Physical Illness ie Pneumonia.Delirium secondary to Physical Illness. Rec continue current treatment . Add Ativan one mg po or Im q 6 hrs prn for agitation and Haldol one mg po bid for few days for agitation and confusion.

## 2015-06-02 NOTE — Evaluation (Signed)
Physical Therapy Evaluation Patient Details Name: Thomas CholJames M Mrozek MRN: [id number] DOB: [birth year] Today's Date: 06/02/2015   History of Present Illness  [facility]    Clinical Impression  Pt is impulsive and rambling t/o much of the session and it is difficult to keep him on task and gather information.  He is not safe with ambulation attempt and though he was able to take a few steps forward and back he needed a lot of cuing and assist to remain safe and "focused."    Follow Up Recommendations SNF (per progress/mental status)    Equipment Recommendations       Recommendations for Other Services       Precautions / Restrictions Precautions Precautions: Fall Restrictions Weight Bearing Restrictions: No      Mobility  Bed Mobility Overal bed mobility: Needs Assistance Bed Mobility: Supine to Sit;Sit to Supine     Supine to sit: Min assist Sit to supine: Min assist      Transfers Overall transfer level: Needs assistance Equipment used: Rolling walker (2 wheeled) Transfers: Sit to/from Stand Sit to Stand: Min assist;Mod assist         General transfer comment: Pt needs much cuing and encouragement to get up to standing, has some impulsiveness   Ambulation/Gait Ambulation/Gait assistance: Mod assist;Max assist Ambulation Distance (Feet): 4 Feet Assistive device: Rolling walker (2 wheeled)       General Gait Details: Pt has considerable unsteadiness and unpredictability with minimal ambulation and is not safe with the effort.  He needs constant cuing and regualr physcial assist to remain safe.  Stairs            Wheelchair Mobility    Modified Rankin (Stroke Patients Only)       Balance                                             Pertinent Vitals/Pain Pain Assessment:  (again difficult to assess secondary to pt mental status)    Home Living Family/patient expects to be discharged  to:: Private residence Living Arrangements: Spouse/significant other;Children               Additional Comments: difficult to get a lot of PLOF and history from pt as he was rambling, often incoherently, and did not directly address most of the questions he was asked    Prior Function           Comments: Difficult to assess, appears her has been using a walker recently?     Hand Dominance        Extremity/Trunk Assessment   Upper Extremity Assessment: Overall WFL for tasks assessed           Lower Extremity Assessment: Generalized weakness (grossly 4-/5 t/o, grossly = b/l)         Communication   Communication:  (rambling, often incoherent, speech)  Cognition                            General Comments      Exercises General Exercises - Lower Extremity Ankle Circles/Pumps: AROM;10 reps Heel Slides: Strengthening;10 reps Hip ABduction/ADduction: Strengthening;10 reps Straight Leg Raises: Strengthening;10 reps      Assessment/Plan    PT Assessment Patient needs continued PT services  PT Diagnosis Difficulty walking;Generalized  weakness   PT Problem List Decreased strength;Decreased range of motion;Decreased activity tolerance;Decreased balance;Decreased mobility;Decreased coordination;Decreased cognition;Decreased knowledge of use of DME;Decreased safety awareness  PT Treatment Interventions Gait training;Stair training;Functional mobility training;Therapeutic activities;Therapeutic exercise;Balance training;Patient/family education;Cognitive remediation   PT Goals (Current goals can be found in the Care Plan section) Acute Rehab PT Goals Patient Stated Goal: Pt indicates that he wants to go back home PT Goal Formulation: With patient Time For Goal Achievement: 06/16/15 Potential to Achieve Goals: Fair    Frequency Min 2X/week   Barriers to discharge        Co-evaluation               End of Session Equipment Utilized During  Treatment: Gait belt Activity Tolerance: Patient limited by fatigue Patient left: with bed alarm set Nurse Communication: Mobility status         Time: 6578-4696 PT Time Calculation (min) (ACUTE ONLY): 25 min   Charges:   PT Evaluation $Initial PT Evaluation Tier I: 1 Procedure PT Treatments $Therapeutic Exercise: 8-22 mins   PT G Codes:       Loran Senters, PT, DPT [phone]  Malachi Pro 06/02/2015, 2:39 PM

## 2015-06-02 NOTE — Consult Note (Signed)
Reason for Consult: confusion Referring Physician: Dr. Deforest Hoyles Thomas [name] is an 69 y.o. male.  HPI: 69 yo RHD M presents to [facility] due to apparent syncope.  Per pt, he never passed out but did feel dizzy so he hit the ground and started crawling.  Pt denies headache, incontinence, tongue biting or shaking episode.  He has never had anything like this per him.  He does state that he just signed out of [facility] AMA due to poor service but states that he is much happier here with the treatment.    Past Medical History  Diagnosis Date  . COPD (chronic obstructive pulmonary disease) ([place])   . Diabetes mellitus without complication ([place])   . Hypertension   . DVT (deep venous thrombosis) ([place])   . Subdural hematoma ([place])   . Paroxysmal atrial fibrillation [facility])     Past Surgical History  Procedure Laterality Date  . Coronary stent placement    . Ivc filter placement ([facility] hx)      Family History  Problem Relation Age of Onset  . Hypertension Other     Social History:  reports that he has quit smoking. He does not have any smokeless tobacco history on file. He reports that he does not drink alcohol. His drug history is not on file.  Allergies: No Known Allergies  Medications: personally reviewed by me as per chart  Results for orders placed or performed during the hospital encounter of 05/31/15 (from the past 48 hour(s))  Basic metabolic panel     Status: Abnormal   Collection Time: 05/31/15  5:48 PM  Result Value Ref Range   Sodium 137 135 - 145 mmol/L   Potassium 4.7 3.5 - 5.1 mmol/L   Chloride 104 101 - 111 mmol/L   CO2 23 22 - 32 mmol/L   Glucose, Bld 112 (H) 65 - 99 mg/dL   BUN 31 (H) 6 - 20 mg/dL   Creatinine, Ser 1.90 (H) 0.61 - 1.24 mg/dL   Calcium 8.5 (L) 8.9 - 10.3 mg/dL   GFR calc non Af Amer 35 (L) >60 mL/min   GFR calc Af Amer 40 (L) >60 mL/min    Comment: (NOTE) The eGFR has been calculated using the CKD EPI equation. This calculation has not been validated in all  clinical situations. eGFR's persistently <60 mL/min signify possible Chronic Kidney Disease.    Anion gap 10 5 - 15  CBC     Status: Abnormal   Collection Time: 05/31/15  5:48 PM  Result Value Ref Range   WBC 16.5 (H) 3.8 - 10.6 K/uL   RBC 2.92 (L) 4.40 - 5.90 MIL/uL   Hemoglobin 8.7 (L) 13.0 - 18.0 g/dL   HCT 26.4 (L) 40.0 - 52.0 %   MCV 90.3 80.0 - 100.0 fL   MCH 29.9 26.0 - 34.0 pg   MCHC 33.1 32.0 - 36.0 g/dL   RDW 14.7 (H) 11.5 - 14.5 %   Platelets 469 (H) 150 - 440 K/uL  Vancomycin, random     Status: None   Collection Time: 05/31/15  5:48 PM  Result Value Ref Range   Vancomycin Rm <4 ug/mL    Comment:        Random Vancomycin therapeutic range is dependent on dosage and time of specimen collection. A peak range is 20.0-40.0 ug/mL A trough range is 5.0-15.0 ug/mL          Glucose, capillary     Status: Abnormal   Collection Time:  05/31/15  5:51 PM  Result Value Ref Range   Glucose-Capillary 112 (H) 65 - 99 mg/dL  Urinalysis complete, with microscopic ([facility] only)     Status: Abnormal   Collection Time: 05/31/15  6:34 PM  Result Value Ref Range   Color, Urine YELLOW (A) YELLOW   APPearance HAZY (A) CLEAR   Glucose, UA NEGATIVE NEGATIVE mg/dL   Bilirubin Urine NEGATIVE NEGATIVE   Ketones, ur NEGATIVE NEGATIVE mg/dL   Specific Gravity, Urine 1.014 1.005 - 1.030   Hgb urine dipstick NEGATIVE NEGATIVE   pH 5.0 5.0 - 8.0   Protein, ur NEGATIVE NEGATIVE mg/dL   Nitrite NEGATIVE NEGATIVE   Leukocytes, UA NEGATIVE NEGATIVE   RBC / HPF 0-5 0 - 5 RBC/hpf   WBC, UA 0-5 0 - 5 WBC/hpf   Bacteria, UA RARE (A) NONE SEEN   Squamous Epithelial / LPF 0-5 (A) NONE SEEN   Mucous PRESENT    Amorphous Crystal PRESENT   Culture, blood (routine x 2)     Status: None (Preliminary result)   Collection Time: 05/31/15  9:20 PM  Result Value Ref Range   Specimen Description BLOOD LEFT ASSIST CONTROL    Special Requests BOTTLES DRAWN AEROBIC AND ANAEROBIC 4CC    Culture  Setup Time       GRAM POSITIVE RODS AEROBIC BOTTLE ONLY CRITICAL RESULT CALLED TO, READ BACK BY AND VERIFIED WITH: MARCEL TURNER AT 2353 ON 06/01/15 RWW CONFIRMED BY [facility]    Culture      GRAM POSITIVE RODS AEROBIC BOTTLE ONLY IDENTIFICATION TO FOLLOW    Report Status PENDING   Culture, blood (routine x 2)     Status: None (Preliminary result)   Collection Time: 05/31/15  9:20 PM  Result Value Ref Range   Specimen Description BLOOD RIGHT ASSIST CONTROL    Special Requests BOTTLES DRAWN AEROBIC AND ANAEROBIC 4CC    Culture NO GROWTH 2 DAYS    Report Status PENDING   Troponin I (q 6hr x 3)     Status: None   Collection Time: 06/01/15  1:45 AM  Result Value Ref Range   Troponin I <0.03 <0.031 ng/mL    Comment:        NO INDICATION OF MYOCARDIAL INJURY.   Troponin I (q 6hr x 3)     Status: None   Collection Time: 06/01/15  7:47 AM  Result Value Ref Range   Troponin I <0.03 <0.031 ng/mL    Comment:        NO INDICATION OF MYOCARDIAL INJURY.   Troponin I (q 6hr x 3)     Status: None   Collection Time: 06/01/15  1:36 PM  Result Value Ref Range   Troponin I <0.03 <0.031 ng/mL    Comment:        NO INDICATION OF MYOCARDIAL INJURY.   CBC     Status: Abnormal   Collection Time: 06/02/15  5:30 AM  Result Value Ref Range   WBC 10.4 3.8 - 10.6 K/uL   RBC 2.61 (L) 4.40 - 5.90 MIL/uL   Hemoglobin 7.6 (L) 13.0 - 18.0 g/dL   HCT 23.6 (L) 40.0 - 52.0 %   MCV 90.6 80.0 - 100.0 fL   MCH 29.1 26.0 - 34.0 pg   MCHC 32.1 32.0 - 36.0 g/dL   RDW 14.8 (H) 11.5 - 14.5 %   Platelets 323 150 - 440 K/uL  Basic metabolic panel     Status: Abnormal   Collection Time: 06/02/15  5:30 AM  Result Value Ref Range   Sodium 138 135 - 145 mmol/L   Potassium 3.9 3.5 - 5.1 mmol/L   Chloride 108 101 - 111 mmol/L   CO2 24 22 - 32 mmol/L   Glucose, Bld 109 (H) 65 - 99 mg/dL   BUN 25 (H) 6 - 20 mg/dL   Creatinine, Ser 1.48 (H) 0.61 - 1.24 mg/dL   Calcium 8.1 (L) 8.9 - 10.3 mg/dL   GFR calc non Af Amer 47 (L) >60  mL/min   GFR calc Af Amer 54 (L) >60 mL/min    Comment: (NOTE) The eGFR has been calculated using the CKD EPI equation. This calculation has not been validated in all clinical situations. eGFR's persistently <60 mL/min signify possible Chronic Kidney Disease.    Anion gap 6 5 - 15    Ct Head Wo Contrast  05/31/2015  CLINICAL DATA:  Pt arrived by EMS after being picked up from home. EMS states pt was at [facility] today for stent placement and was not "happy" with the medicines he was receiving and signed out AMA. EMS stated that upon arrival home pt got out of the.*comment was truncated* EXAM: CT HEAD WITHOUT CONTRAST TECHNIQUE: Contiguous axial images were obtained from the base of the skull through the vertex without intravenous contrast. COMPARISON:  None FINDINGS: Atherosclerotic and physiologic intracranial calcifications. Mild atrophy. There is no evidence of acute intracranial hemorrhage, brain edema, mass lesion, acute infarction, mass effect, or midline shift. Acute infarct may be inapparent on noncontrast CT. No other intra-axial abnormalities are seen, and the ventricles and sulci are within normal limits in size and symmetry. No abnormal extra-axial fluid collections or masses are identified. No significant calvarial abnormality. IMPRESSION: 1. Negative for bleed or other acute intracranial process. Electronically Signed   By: Lucrezia Europe M.D.   On: 05/31/2015 19:30   Dg Chest Port 1 View  05/31/2015  CLINICAL DATA:  Patient history of bronchitis and pneumonia. Syncopal episode. EXAM: PORTABLE CHEST 1 VIEW COMPARISON:  None. FINDINGS: Limited portable chest radiograph. Enlarged cardiac and mediastinal contours. Heterogeneous opacities within the lower lungs bilaterally. Linear opacity right mid lung. No large pleural effusion or pneumothorax. IMPRESSION: Limited exam due to portable technique. Heterogeneous opacities within the lung bases bilaterally favored to represent atelectasis. Linear  opacity within the right midlung is nonspecific and represent atelectasis or scarring. Recommend comparison with outside prior exams or repeat PA and lateral chest radiograph for further evaluation. Electronically Signed   By: Lovey Newcomer M.D.   On: 05/31/2015 21:30    Review of Systems  Unable to perform ROS: mental status change   Blood pressure 102/51, pulse 70, temperature 97.6 F (36.4 C), temperature source Oral, resp. rate 18, height 6' 1"  (1.854 m), weight 124.467 kg (274 lb 6.4 oz), SpO2 99 %. Physical Exam  Nursing note and vitals reviewed. Constitutional: He appears well-developed and well-nourished. No distress.  HENT:  Head: Normocephalic.  Right Ear: External ear normal.  Left Ear: External ear normal.  Nose: Nose normal.  Mouth/Throat: Oropharynx is clear and moist.  Eyes: Conjunctivae and EOM are normal. Pupils are equal, round, and reactive to light. No scleral icterus.  Neck: Normal range of motion. Neck supple.  Cardiovascular: Normal rate, regular rhythm, normal heart sounds and intact distal pulses.   No murmur heard. Respiratory: Effort normal and breath sounds normal. No respiratory distress.  GI: Soft. Bowel sounds are normal. He exhibits no distension.  Musculoskeletal: Normal range of motion.  He exhibits no edema.  Neurological:  Alert but oriented only to person, does follow most commands, severe dysarthria PERRLA, EOMI, nl VF, face symmetric, tongue midline 5/5 B, nl tone FTN and HTS WNL 1+/4 B, mute plantars Pin and temp sensation intact  Skin: Skin is warm. He is not diaphoretic.  Psychiatric: His affect is labile. His speech is tangential. He is hyperactive.   CT of head personally reviewed by me and normal  Assessment/Plan: 1.  Encephalopathy-  This appears to be secondary to infection with increased WBC on admission but could be EtOH or other toxic/metabolic causes as well 2.  Syncope- likely due to 1. And not a seizure 3.  Tangential thoughts-   Could be underlying psychiatric disorder -  Agree with Psychiatry consult -  EEG -  Treat infections -  Will check B12/folate, TSH, RPR, CRP -  PRN seroquel for agitation -  Will follow  Nea Gittens 06/02/2015, 12:50 PM

## 2015-06-02 NOTE — Progress Notes (Signed)
Notified Dr.Hower of positive blood cultures, no new orders.

## 2015-06-02 NOTE — Plan of Care (Signed)
Problem: Education: Goal: Knowledge of disease or condition will improve Outcome: Not Met (add Reason) Patient is confused and has inappropiate conversations with staff members.

## 2015-06-02 NOTE — Progress Notes (Signed)
ANTIBIOTIC CONSULT NOTE - INITIAL  Pharmacy Consult for Vancomycin and Zosyn dosing Indication: HCAP  No Known Allergies  Patient Measurements: Height: 6\' 1"  (185.4 cm) Weight: 274 lb 6.4 oz (124.467 kg) IBW/kg (Calculated) : 79.9 Adjusted Body Weight: 98kg  Vital Signs: Temp: 97.6 F (36.4 C) (11/20 1143) Temp Source: Oral (11/20 1143) BP: 102/51 mmHg (11/20 1143) Pulse Rate: 70 (11/20 1143) Intake/Output from previous day: 11/19 0701 - 11/20 0700 In: 960 [P.O.:360; IV Piggyback:600] Out: 0  Intake/Output from this shift: Total I/O In: -  Out: 250 [Urine:250]  Labs:  Recent Labs  05/31/15 1748 06/02/15 0530  WBC 16.5* 10.4  HGB 8.7* 7.6*  PLT 469* 323  CREATININE 1.90* 1.48*   Estimated Creatinine Clearance: 66 mL/min (by C-G formula based on Cr of 1.48).  Recent Labs  05/31/15 1748  [facility]     Microbiology: Recent Results (from the past 720 hour(s))  Culture, blood (routine x 2)     Status: None (Preliminary result)   Collection Time: 05/31/15  9:20 PM  Result Value Ref Range Status   Specimen Description BLOOD LEFT ASSIST CONTROL  Final   Special Requests BOTTLES DRAWN AEROBIC AND ANAEROBIC 4CC  Final   Culture  Setup Time   Final    GRAM POSITIVE RODS AEROBIC BOTTLE ONLY CRITICAL RESULT CALLED TO, READ BACK BY AND VERIFIED WITH: MARCEL TURNER AT 2353 ON 06/01/15 RWW CONFIRMED BY [facility]    Culture   Final    GRAM POSITIVE RODS AEROBIC BOTTLE ONLY IDENTIFICATION TO FOLLOW    Report Status PENDING  Incomplete  Culture, blood (routine x 2)     Status: None (Preliminary result)   Collection Time: 05/31/15  9:20 PM  Result Value Ref Range Status   Specimen Description BLOOD RIGHT ASSIST CONTROL  Final   Special Requests BOTTLES DRAWN AEROBIC AND ANAEROBIC 4CC  Final   Culture NO GROWTH 2 DAYS  Final   Report Status PENDING  Incomplete    Medical History: Past Medical History  Diagnosis Date  . COPD (chronic obstructive pulmonary  disease) ([facility])   . Diabetes mellitus without complication ([facility])   . Hypertension   . DVT (deep venous thrombosis) ([facility])   . Subdural hematoma ([facility])   . Paroxysmal atrial fibrillation ([facility])     Medications:   Assessment: Blood cx GPR UA: (-) CXR: heterogeneous opacities  Goal of Therapy:  Vancomycin trough level 15-20 mcg/ml  Plan:  Vancomycin TBW 124.5kg  IBW 79.9kg  DW 98kg  Vd 69L kei 0.048 hr-1  t1/2 14 hours 1500 mg q 18 hours ordered with stacked dosing. Level before 5th dose.  Zosyn 4.5 grams q 8 hours ordered for Pseudomonas risk of previous abx usage. Note: Wt 124.5 kg  11/20: Scr improved from 1.90 to 1.48, dosing however does not need adjustment. Continue to follow.  Bari MantisKristin Kaija Kovacevic PharmD Clinical Pharmacist 06/02/2015 1:59 PM

## 2015-06-02 NOTE — Progress Notes (Addendum)
[facility] [place] [facility]   PATIENT NAME: Thomas BarterJames Eke    MR#:  [id number]  DATE OF BIRTH:  [birth year]  SUBJECTIVE:  CHIEF COMPLAINT:   Chief Complaint  Patient presents with  . Loss of Consciousness   Alert. Slightly more oriented today. Still paranoid and vague. Denies any symptoms  REVIEW OF SYSTEMS:   ROS unable to obtain as patient will not answer any questions  DRUG ALLERGIES:  No Known Allergies  VITALS:  Blood pressure 102/51, pulse 70, temperature 97.6 F (36.4 C), temperature source Oral, resp. rate 18, height 6\' 1"  (1.854 m), weight 124.467 kg (274 lb 6.4 oz), SpO2 99 %.  PHYSICAL EXAMINATION:  GENERAL:  69 y.o.-year-old patient lying in the bed with no acute distress.  EYES: Pupils equal, round, reactive to light and accommodation. No scleral icterus. Extraocular muscles intact.  HEENT: Head atraumatic, normocephalic. Oropharynx and nasopharynx clear.  NECK:  Supple, no jugular venous distention. No thyroid enlargement, no tenderness.  LUNGS: Normal breath sounds bilaterally, no wheezing, rales,rhonchi or crepitation. No use of accessory muscles of respiration.  CARDIOVASCULAR: S1, S2 normal. No murmurs, rubs, or gallops.  ABDOMEN: Soft, nontender, distended. Bowel sounds present. No organomegaly or mass.  EXTREMITIES: No pedal edema, cyanosis, or clubbing.  NEUROLOGIC: Left-sided facial droop with twitching, moves all 4 extremities spontaneously, speech is normal though nonsensical, will not participate in neurologic examination PSYCHIATRIC: The patient is alert, will not answer questions states that he does not want to SKIN: Abrasion over the scalp, left hand, left arm   LABORATORY PANEL:   CBC  Recent Labs Lab 06/02/15 0530  WBC 10.4  HGB 7.6*  HCT 23.6*  PLT 323   ------------------------------------------------------------------------------------------------------------------  Chemistries   Recent Labs Lab  06/02/15 0530  NA 138  K 3.9  CL 108  CO2 24  GLUCOSE 109*  BUN 25*  CREATININE 1.48*  CALCIUM 8.1*   ------------------------------------------------------------------------------------------------------------------  Cardiac Enzymes  Recent Labs Lab 06/01/15 1336  TROPONINI <0.03   ------------------------------------------------------------------------------------------------------------------  RADIOLOGY:  Ct Head Wo Contrast  05/31/2015  CLINICAL DATA:  Pt arrived by EMS after being picked up from home. [facility] and was not "happy" with the medicines he was receiving and signed out AMA. EMS stated that upon arrival home pt got out of the.*comment was truncated* EXAM: CT HEAD WITHOUT CONTRAST TECHNIQUE: Contiguous axial images were obtained from the base of the skull through the vertex without intravenous contrast. COMPARISON:  None FINDINGS: Atherosclerotic and physiologic intracranial calcifications. Mild atrophy. There is no evidence of acute intracranial hemorrhage, brain edema, mass lesion, acute infarction, mass effect, or midline shift. Acute infarct may be inapparent on noncontrast CT. No other intra-axial abnormalities are seen, and the ventricles and sulci are within normal limits in size and symmetry. No abnormal extra-axial fluid collections or masses are identified. No significant calvarial abnormality. IMPRESSION: 1. Negative for bleed or other acute intracranial process. Electronically Signed   By: Corlis Leak  Hassell M.D.   On: 05/31/2015 19:30   Dg Chest Port 1 View  05/31/2015  CLINICAL DATA:  Patient history of bronchitis and pneumonia. Syncopal episode. EXAM: PORTABLE CHEST 1 VIEW COMPARISON:  None. FINDINGS: Limited portable chest radiograph. Enlarged cardiac and mediastinal contours. Heterogeneous opacities within the lower lungs bilaterally. Linear opacity right mid lung. No large pleural effusion or pneumothorax. IMPRESSION:  Limited exam due to portable technique. Heterogeneous opacities within the lung bases bilaterally favored to represent  atelectasis. Linear opacity within the right midlung is nonspecific and represent atelectasis or scarring. Recommend comparison with outside prior exams or repeat PA and lateral chest radiograph for further evaluation. Electronically Signed   By: Annia Belt M.D.   On: 05/31/2015 21:30    EKG:   Orders placed or performed during the hospital encounter of 05/31/15  . ED EKG  . ED EKG  . EKG 12-Lead  . EKG 12-Lead    ASSESSMENT AND PLAN:    1. Syncope: Likely related to pneumonia. Troponins negative. No evidence on telemetry. Continue hydration. No further episodes  2. Healthcare associated pneumonia: Has had prolonged hospitalization at [facility]. Denies shortness of breath or cough. Lung exam is normal. Chest x-ray shows possible linear opacity in the right midlung and heterogeneous opacities within the lung bases. Poor quality exam, will repeat in the morning. No cough, fever, respiratory distress. Does have a leukocytosis  3. Acute kidney injury: Worsening renal function since last checked [facility] IV fluid hydration and follow renal function/urinary output. Of note had acute kidney injury on last hospitalization creatinine maximum at 3 however improved prior to [facility]. Renal function is improving.  4. Essential hypertension: Coreg. Monitor closely blood pressure has been slightly low.  5. Paroxysmal atrial fibrillation: Continue with Cardizem hold on anticoagulation given recent subdural hematoma  6. Venous thromboembolism prophylactic: SCDs  7. altered mental status: Appreciate neurology consultation. Likely encephalopathy, though this may also be due to ongoing psychiatric condition. I have discussed with his wife who states that he is usually alert and oriented and without behavioral disturbance.  8. Anemia: Check iron panel  9. Gram-positive rod bacteremia: Possibly a  contaminant. Currently covering probably with vancomycin and Zosyn  All the records are reviewed and case discussed with Care Management/Social Workerr. Management plans discussed with the patient, family and they are in agreement.  CODE STATUS: Full  TOTAL TIME TAKING CARE OF THIS PATIENT: 35 minutes.  Greater than 50% of time spent in care coordination and counseling. POSSIBLE D/C IN 2 DAYS, DEPENDING ON CLINICAL CONDITION.   Elby Showers M.D on 06/02/2015 at 2:07 PM  Between 7am to 6pm - Pager - [phone]  After 6pm go to [email or web address] - password [email or web address] [facility]  [place]  Hospitalists  Office  [phone]  CC: Primary care physician; No PCP Per Patient

## 2015-06-03 ENCOUNTER — Inpatient Hospital Stay: Payer: Non-veteran care

## 2015-06-03 LAB — CBC
HCT: 24.7 % — ABNORMAL LOW (ref 40.0–52.0)
HEMOGLOBIN: 8.3 g/dL — AB (ref 13.0–18.0)
MCH: 30.6 pg (ref 26.0–34.0)
MCHC: 33.6 g/dL (ref 32.0–36.0)
MCV: 91.2 fL (ref 80.0–100.0)
Platelets: 319 10*3/uL (ref 150–440)
RBC: 2.71 MIL/uL — AB (ref 4.40–5.90)
RDW: 15.1 % — ABNORMAL HIGH (ref 11.5–14.5)
WBC: 10.5 10*3/uL (ref 3.8–10.6)

## 2015-06-03 LAB — BASIC METABOLIC PANEL
ANION GAP: 9 (ref 5–15)
BUN: 20 mg/dL (ref 6–20)
CALCIUM: 8.5 mg/dL — AB (ref 8.9–10.3)
CHLORIDE: 106 mmol/L (ref 101–111)
CO2: 21 mmol/L — ABNORMAL LOW (ref 22–32)
Creatinine, Ser: 1.57 mg/dL — ABNORMAL HIGH (ref 0.61–1.24)
GFR, EST AFRICAN AMERICAN: 51 mL/min — AB (ref >60)
GFR, EST NON AFRICAN AMERICAN: 44 mL/min — AB (ref >60)
Glucose, Bld: 118 mg/dL — ABNORMAL HIGH (ref 65–99)
Potassium: 3.8 mmol/L (ref 3.5–5.1)
SODIUM: 136 mmol/L (ref 135–145)

## 2015-06-03 LAB — RPR: RPR: NONREACTIVE

## 2015-06-03 LAB — VANCOMYCIN, TROUGH: Vancomycin Tr: 21 ug/mL (ref 10–20)

## 2015-06-03 LAB — HIV ANTIBODY (ROUTINE TESTING W REFLEX): HIV SCREEN 4TH GENERATION: NONREACTIVE

## 2015-06-03 MED ORDER — INFLUENZA VAC SPLIT QUAD 0.5 ML IM SUSY
0.5000 mL | PREFILLED_SYRINGE | INTRAMUSCULAR | Status: DC
  Filled 2015-06-03: qty 0.5

## 2015-06-03 MED ORDER — VANCOMYCIN HCL 10 G IV SOLR
1500.0000 mg | INTRAVENOUS | Status: DC
  Filled 2015-06-03: qty 1500

## 2015-06-03 NOTE — Progress Notes (Signed)
Vanc and Zosyn are compatible per Pharmacist, Barbara CowerJason.

## 2015-06-03 NOTE — Care Management (Addendum)
[facility]  Patient had been admitted with pneumonia.  Prior to the admission, he was independent in all adls and driving.  Lives with his wife and adult daughter.  Patient does not have chronic home 02.  Says he is not eligible for Medicare because "I did not work enough quarters.  [facility]   Upon reviewing patient's H/P and [facility]- patient was admitted after a fall - sudural hematoma- aspiration pneumonia and was on the ventilator.  Patient, his wife nor daughter Lenard GallowayMickey makes any mention of this.  Fax[facility] and left VM message to discuss this admission, the need for home health or skilled nursing and the only payor is [facility] [employer].  Spoke with patient's wife.  She says "If he can't stand up and walk by himself, I can not take care of him."  Says that patient was nit in [facility]. He is about at 80%.  She does not clearly answer when asked if patient had any history of mental issues.   Patient is currently on 02.  Instructed primary nurse of need to assess for need of home 02.  Patient does not answer question - "would you like for us to initiate [facility].  His wife does not either.  At present, i am not sure whether the patient has capacity to make decisions.  Psych consult is pending.

## 2015-06-03 NOTE — Progress Notes (Signed)
[facility] - [place] at [facility]   PATIENT NAME: Thomas [name]    MR#:  [id number]  DATE OF BIRTH:  24-Dec-1945  SUBJECTIVE:  CHIEF COMPLAINT:   Chief Complaint  Patient presents with  . Loss of Consciousness   Alert. Slightly more oriented today, though still fairly confused. Very weak and unable to walk  REVIEW OF SYSTEMS:   ROS denies any symptoms states that he is "fine"  DRUG ALLERGIES:  No Known Allergies  VITALS:  Blood pressure 112/58, pulse 67, temperature 97.6 F (36.4 C), temperature source Oral, resp. rate 18, height  (1.854 m), weight 124.467 kg (274 lb 6.4 oz), SpO2 99 %.  PHYSICAL EXAMINATION:  GENERAL:  69 y.o.-year-old patient lying in the bed with no acute distress.  EYES: Pupils equal, round, reactive to light and accommodation. No scleral icterus. Extraocular muscles intact.  HEENT: Head atraumatic, normocephalic. Oropharynx and nasopharynx clear.  NECK:  Supple, no jugular venous distention. No thyroid enlargement, no tenderness.  LUNGS: Normal breath sounds bilaterally, no wheezing, rales,rhonchi or crepitation. No use of accessory muscles of respiration.  CARDIOVASCULAR: S1, S2 normal. No murmurs, rubs, or gallops.  ABDOMEN: Soft, nontender, distended. Bowel sounds present. No organomegaly or mass.  EXTREMITIES: No pedal edema, cyanosis, or clubbing.  NEUROLOGIC: Left-sided facial droop with twitching, moves all 4 extremities spontaneously, speech is normal though nonsensical at times, will not participate in neurologic examination PSYCHIATRIC: The patient is alert, conversant, oriented to person SKIN: Abrasion over the scalp, left hand, left arm   LABORATORY PANEL:   CBC  Recent Labs Lab 06/03/15 0422  WBC 10.5  HGB 8.3*  HCT 24.7*  PLT 319   ------------------------------------------------------------------------------------------------------------------  Chemistries   Recent Labs Lab 06/03/15 0422  NA  136  K 3.8  CL 106  CO2 21*  GLUCOSE 118*  BUN 20  CREATININE 1.57*  CALCIUM 8.5*   ------------------------------------------------------------------------------------------------------------------  Cardiac Enzymes  Recent Labs Lab 06/01/15 1336  TROPONINI <0.03   ------------------------------------------------------------------------------------------------------------------  RADIOLOGY:  No results found.  EKG:   Orders placed or performed during the hospital encounter of 05/31/15  . ED EKG  . ED EKG  . EKG 12-Lead  . EKG 12-Lead    ASSESSMENT AND PLAN:    1. Syncope: Likely related to pneumonia. Troponins negative. No evidence on telemetry. Continue hydration. No further episodes  2. Healthcare associated pneumonia: Has had prolonged hospitalization at [facility]. Denies shortness of breath or cough. Lung exam is normal. Chest x-ray shows possible linear opacity in the right midlung and heterogeneous opacities within the lung bases. Poor quality exam, will repeat in the morning. No cough, fever, respiratory distress. Does have a leukocytosis  3. Acute kidney injury: Renal function is stable  4. Essential hypertension: Coreg. Monitor closely blood pressure has been slightly low.  5. Paroxysmal atrial fibrillation: Continue with Cardizem hold on anticoagulation given recent subdural hematoma  6. Venous thromboembolism prophylactic: SCDs  7. altered mental status: Appreciate neurology and psychiatry consultation. Likely encephalopathy, though this may also be due to ongoing psychiatric condition. I have discussed with his wife who states that he is usually alert and oriented and without behavioral disturbance. Family denies history of alcohol abuse. Elevated sedimentation rate, though in setting of infection difficult to interpret. B12 and TSH are normal  8. 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 Deconditioning: Patient still unable to relate without significant  assistance. Family will not take him home until he is more stable.  Physical therapy recommended skilled nursing. Patient states he will not go to skilled nursing, but he is not oriented. He states the year is [id number]. He does not remember recent events. Still quite encephalopathic.  All the records are reviewed and case discussed with Care Management/Social Workerr. Management plans discussed with the patient, family and they are in agreement.  CODE STATUS: Full  TOTAL TIME TAKING CARE OF THIS PATIENT: 35 minutes.  Greater than 50% of time spent in care coordination and counseling. POSSIBLE D/C IN 2 DAYS, DEPENDING ON CLINICAL CONDITION.   Elby ShowersWALSH, Glendola Friedhoff M.D on 06/03/2015 at 5:14 PM  Between 7am to 6pm - Pager - [phone]  After 6pm go to [email or web address] - password [email or web address] [facility]  [facility]  Office  [phone]  CC: Primary care physician; No PCP Per Patient

## 2015-06-03 NOTE — Progress Notes (Signed)
ANTIBIOTIC CONSULT NOTE - INITIAL  Pharmacy Consult for Vancomycin and Zosyn dosing Indication: HCAP  No Known Allergies  Patient Measurements: Height: 6\' 1"  (185.4 cm) Weight: 274 lb 6.4 oz (124.467 kg) IBW/kg (Calculated) : 79.9 Adjusted Body Weight: 98kg  Vital Signs: Temp: 97.6 F (36.4 C) (11/21 1158) Temp Source: Oral (11/21 1158) BP: 112/58 mmHg (11/21 1212) Pulse Rate: 67 (11/21 1212) Intake/Output from previous day: 11/20 0701 - 11/21 0700 In: 483 [P.O.:480; I.V.:3] Out: 250 [Urine:250] Intake/Output from this shift: Total I/O In: 240 [P.O.:240] Out: -   Labs:  Recent Labs  05/31/15 1748 06/02/15 0530 06/03/15 0422  WBC 16.5* 10.4 10.5  HGB 8.7* 7.6* 8.3*  PLT 469* 323 319  CREATININE 1.90* 1.48* 1.57*   Estimated Creatinine Clearance: 62.2 mL/min (by C-G formula based on Cr of 1.57).  Recent Labs  05/31/15 1748 06/03/15 1044  VANCOTROUGH  --  21*  VANCORANDOM <4  --      Microbiology: Recent Results (from the past 720 hour(s))  Culture, blood (routine x 2)     Status: None (Preliminary result)   Collection Time: 05/31/15  9:20 PM  Result Value Ref Range Status   Specimen Description BLOOD LEFT ASSIST CONTROL  Final   Special Requests BOTTLES DRAWN AEROBIC AND ANAEROBIC 4CC  Final   Culture  Setup Time   Final    GRAM POSITIVE RODS AEROBIC BOTTLE ONLY CRITICAL RESULT CALLED TO, READ BACK BY AND VERIFIED WITH: MARCEL TURNER AT 2353 ON 06/01/15 [facility] CONFIRMED BY [facility]    Culture   Final    GRAM POSITIVE RODS AEROBIC BOTTLE ONLY Results consistent with contamination.    Report Status PENDING  Incomplete  Culture, blood (routine x 2)     Status: None (Preliminary result)   Collection Time: 05/31/15  9:20 PM  Result Value Ref Range Status   Specimen Description BLOOD RIGHT ASSIST CONTROL  Final   Special Requests BOTTLES DRAWN AEROBIC AND ANAEROBIC 4CC  Final   Culture NO GROWTH 2 DAYS  Final   Report Status PENDING  Incomplete    Medical  History: Past Medical History  Diagnosis Date  . COPD (chronic obstructive pulmonary disease) (HCC)   . Diabetes mellitus without complication (HCC)   . Hypertension   . DVT (deep venous thrombosis) (HCC)   . Subdural hematoma (HCC)   . Paroxysmal atrial fibrillation (HCC)     Medications:   Assessment: Currently ordered Vancomycin 1500mg  IV q18h and Zosyn 4.5g IV q8h EI for pneumonia.  11/21 Vanc trough resulted at 5221mcg/ml  Goal of Therapy:  Vancomycin trough level 15-20 mcg/ml  Plan:  Zosyn 4.5 grams q 8 hours ordered for Pseudomonas risk of previous abx usage. Note: Wt 124.5 kg  Will decrease to Vancomycin 1500mg  IV q24h.  Clovia CuffLisa Athalee Esterline, PharmD, BCPS 06/03/2015 3:11 PM

## 2015-06-03 NOTE — Progress Notes (Signed)
Physical Therapy Treatment Patient Details Name: Thomas [name] MRN: [id number] DOB: [birth year] Today's Date: 06/03/2015    History of Present Illness [name] here with syncope after leaving [facility] 11/17.      [name] Comments    [name] able to take a few steps forwards and backwards with RW with assist but limited distance d/t [name] reporting feeling like his legs were going to "give out" d/t weakness.  Continue to recommend [name] discharge to STR d/t impairments with balance, safety, strength, and activity tolerance.  [name]'s daughter reports wanting to take [name] home (already bought manual w/c and has ramp to enter home).   Follow Up Recommendations  SNF     Equipment Recommendations  Rolling walker with 5" wheels    Recommendations for Other Services       Precautions / Restrictions Precautions Precautions: Fall Restrictions Weight Bearing Restrictions: No    Mobility  Bed Mobility Overal bed mobility: Needs Assistance Bed Mobility: Supine to Sit;Sit to Supine     Supine to sit: Mod assist;HOB elevated (assist for trunk) Sit to supine: Min assist;HOB elevated (assist for trunk and LE's)   General bed mobility comments: vc's required for safety  Transfers Overall transfer level: Needs assistance Equipment used: Rolling walker (2 wheeled) Transfers: Sit to/from Stand Sit to Stand: Min assist         General transfer comment: [name] utilizing rocking method to stand up; assist to stabilize RW; vc's for safety required  Ambulation/Gait Ambulation/Gait assistance: Min guard;Min assist;+2 physical assistance;+2 safety/equipment Ambulation Distance (Feet): 5 Feet Assistive device: Rolling walker (2 wheeled)       General Gait Details: decreased B step length/foot clearance/heelstrike; mildly unsteady; mildly antalgic (decreased stance time R LE); limited distance d/t [name] reporting feeling like his LE's were going to "give out"   Stairs            Wheelchair Mobility     Modified Rankin (Stroke Patients Only)       Balance Overall balance assessment: Needs assistance Sitting-balance support: Bilateral upper extremity supported;Feet supported Sitting balance-Leahy Scale: Good     Standing balance support: Bilateral upper extremity supported (on RW) Standing balance-Leahy Scale: Fair                      Cognition Arousal/Alertness: Awake/alert Behavior During Therapy: WFL for tasks assessed/performed Overall Cognitive Status:  (Oriented to year but not month; oriented to person, place, situation)                      Exercises      General Comments General comments (skin integrity, edema, etc.): R ankle appearing swollen (nursing notified)  Nursing cleared [name] for participation in physical therapy.  [name] agreeable to [name] session.      Pertinent Vitals/Pain Pain Assessment: 0-10 Pain Score: 4  Pain Location: R ankle pain Pain Descriptors / Indicators: Sore;Tender Pain Intervention(s): Limited activity within patient's tolerance;Monitored during session;Repositioned    Home Living                      Prior Function            [name] Goals (current goals can now be found in the care plan section) Acute Rehab [name] Goals Patient Stated Goal: [name] requests to discharge home [name] Goal Formulation: With patient Time For Goal Achievement: 06/16/15 Potential to Achieve Goals: Fair Progress towards [name] goals: Progressing toward goals  Frequency  Min 2X/week    [name] Plan Current plan remains appropriate    Co-evaluation             End of Session Equipment Utilized During Treatment: Gait belt;Oxygen Activity Tolerance: Patient limited by fatigue Patient left: in bed;with call bell/phone within reach;with bed alarm set     Time: 1400-1425 [name] Time Calculation (min) (ACUTE ONLY): 25 min  Charges:  $Therapeutic Activity: 23-37 mins                    G CodesHendricks [name]:      Thomas [name] 06/03/2015, 2:45 PM Thomas [name]  Thomas [name] [phone]

## 2015-06-03 NOTE — Care Management (Signed)
Spoke with Jacki ConesLaurie from [facility].  Patient's benefits are not services realted so he does not have any coverage for any type of long term care.  Discussed with Jacki ConesLaurie that the recommendations are for  short term skilled nursing and if patient / family continue to refused, would have to have consult from patient"s PCP - correct spelling now Dr Barnetta ChapelWhelan.  CM place call to the clinic for home health nurse, physical therapy, aide, ot and sw consult.  If it is decided to pursue skilled nursing- would need to contact Monica  x [phone].  Have left voicemail message for Mrs Royal HawthornBetts to discuss that physical therapy has continued to recommend skilled nursing and that patient should not be left alone, and would take maximum assist and supervision with ambulation attempts

## 2015-06-03 NOTE — Progress Notes (Signed)
Dr. Clent RidgesWalsh aware of BP and HR. Order to hold coreg and cardizem. Pt requesting pain pill instead of morphine. Per MD, only give patient tylenol due to confusion.

## 2015-06-03 NOTE — Progress Notes (Signed)
Vanc trough not resulted yet, per Clydie BraunKaren, Pharmacist. Ok to go ahead and start scheduled vanc and they will adjust future doses if needed.

## 2015-06-03 NOTE — Progress Notes (Signed)
NEUROLOGY NOTE  S: family is at bedside, state that he is [facility] but still not better, feel like he is about at 80% of his baseline  ROS unobtainable to confusion  O: 97.9    111/60    68    20 No distress, overweight Normocephalic, oropharynx clear Supple, no JVD CTA B, no wheezing RRR, no murmur No C/C/E  Alert but oriented only to person, does follow simple commands, severe dysarthria PERRLA, EOMI, L eye blepharspams, face symmetric 5/5 B, nl tone  EEG with mild slowing  A/P: 1. Encephalopathy-  Mild on EEG but still significant confusion noted;  Severely elevated inflammatory markers point to auto-immune vs. Infectious etiology. -  Check ANA, CRP, complement, thyroglobulin and thyroid perioxidase -  Continue antibiotics -  Will follow briefly

## 2015-06-04 ENCOUNTER — Inpatient Hospital Stay: Payer: Non-veteran care

## 2015-06-04 DIAGNOSIS — I82409 Acute embolism and thrombosis of unspecified deep veins of unspecified lower extremity: Secondary | ICD-10-CM | POA: Diagnosis not present

## 2015-06-04 DIAGNOSIS — G9340 Encephalopathy, unspecified: Secondary | ICD-10-CM | POA: Diagnosis present

## 2015-06-04 LAB — BASIC METABOLIC PANEL
Anion gap: 8 (ref 5–15)
BUN: 16 mg/dL (ref 6–20)
CALCIUM: 8.5 mg/dL — AB (ref 8.9–10.3)
CHLORIDE: 106 mmol/L (ref 101–111)
CO2: 23 mmol/L (ref 22–32)
CREATININE: 1.43 mg/dL — AB (ref 0.61–1.24)
GFR, EST AFRICAN AMERICAN: 57 mL/min — AB (ref >60)
GFR, EST NON AFRICAN AMERICAN: 49 mL/min — AB (ref >60)
Glucose, Bld: 119 mg/dL — ABNORMAL HIGH (ref 65–99)
Potassium: 3.6 mmol/L (ref 3.5–5.1)
SODIUM: 137 mmol/L (ref 135–145)

## 2015-06-04 LAB — ANA COMPREHENSIVE PANEL
Centromere Ab Screen: <0.2 AI (ref 0.0–0.9)
ENA SM Ab Ser-aCnc: <0.2 AI (ref 0.0–0.9)
Ribonucleic Protein: 1.1 AI — ABNORMAL HIGH (ref 0.0–0.9)
SSB (La) (ENA) Antibody, IgG: <0.2 AI (ref 0.0–0.9)

## 2015-06-04 LAB — CBC
HCT: 24.4 % — ABNORMAL LOW (ref 40.0–52.0)
Hemoglobin: 7.7 g/dL — ABNORMAL LOW (ref 13.0–18.0)
MCH: 28.6 pg (ref 26.0–34.0)
MCHC: 31.5 g/dL — AB (ref 32.0–36.0)
MCV: 90.7 fL (ref 80.0–100.0)
PLATELETS: 327 10*3/uL (ref 150–440)
RBC: 2.69 MIL/uL — ABNORMAL LOW (ref 4.40–5.90)
RDW: 15 % — AB (ref 11.5–14.5)
WBC: 11.2 10*3/uL — ABNORMAL HIGH (ref 3.8–10.6)

## 2015-06-04 LAB — THYROID PEROXIDASE ANTIBODY: Thyroperoxidase Ab SerPl-aCnc: 10 IU/mL (ref 0–34)

## 2015-06-04 LAB — ANCA TITERS
Atypical P-ANCA titer: <1:20 {titer}
C-ANCA: <1:20 {titer}

## 2015-06-04 LAB — THYROGLOBULIN ANTIBODY: Thyroglobulin Antibody: <1 IU/mL (ref 0.0–0.9)

## 2015-06-04 LAB — HIGH SENSITIVITY CRP: CRP, High Sensitivity: 72.64 mg/L — ABNORMAL HIGH (ref 0.00–3.00)

## 2015-06-04 MED ORDER — LEVOFLOXACIN 500 MG PO TABS
500.0000 mg | ORAL_TABLET | Freq: Every day | ORAL | Status: DC
  Administered 2015-06-04 – 2015-06-05 (×2): 500 mg via ORAL
  Filled 2015-06-04 (×2): qty 1

## 2015-06-04 MED ORDER — APIXABAN 5 MG PO TABS: 5.0000 mg | ORAL_TABLET | Freq: Two times a day (BID) | ORAL | Status: AC

## 2015-06-04 MED ORDER — APIXABAN 5 MG PO TABS
10.0000 mg | ORAL_TABLET | Freq: Two times a day (BID) | ORAL | Status: DC
  Administered 2015-06-04 – 2015-06-05 (×3): 10 mg via ORAL
  Filled 2015-06-04 (×3): qty 2

## 2015-06-04 MED ORDER — APIXABAN 5 MG PO TABS: 10.0000 mg | ORAL_TABLET | Freq: Two times a day (BID) | ORAL | Status: DC

## 2015-06-04 MED ORDER — APIXABAN 5 MG PO TABS: 5.0000 mg | ORAL_TABLET | Freq: Two times a day (BID) | ORAL | Status: DC

## 2015-06-04 MED ORDER — LEVOFLOXACIN 500 MG PO TABS: 500.0000 mg | ORAL_TABLET | Freq: Every day | ORAL | Status: AC

## 2015-06-04 MED ORDER — CHOLECALCIFEROL 50 MCG (2000 UT) PO TABS: 2000.0000 [IU] | ORAL_TABLET | Freq: Every day | ORAL | Status: AC

## 2015-06-04 NOTE — Progress Notes (Signed)
Report given to Thomas [name], Charity fundraiserN. Pt cleaned, new linen provided. Pt stable with no complaints. Telemetry box removed. Family notified. Transferred off unit by orderly in no signs/symptoms of distress.

## 2015-06-04 NOTE — Clinical Social Work Note (Signed)
Clinical Social Worker was updated that pt has an open Adult Protective Services case with [facility]. APS workers are [employer] Owenby [phone]) and Irving ShowsScott Hunsaker [phone]) are working with pt. Pt is currently off the unit at ultrasound. PT is recommending SNF, however family is declining and is in favor of home health services. A second psych consult is pending. CSW updated MD. CSW will continue to follow.    Dede QuerySarah Ayce Pietrzyk, MSW, LCSW Clinical Social Worker  [phone]

## 2015-06-04 NOTE — Consult Note (Signed)
ANTICOAGULATION CONSULT NOTE - Initial Consult  Pharmacy Consult for apixaban Indication: DVT  No Known Allergies  Patient Measurements: Height: 6\' 1"  (185.4 cm) Weight: 255 lb 9.6 oz (115.939 kg) IBW/kg (Calculated) : 79.9 Heparin Dosing Weight:   Vital Signs: Temp: 97.5 F (36.4 C) (11/22 0504) Temp Source: Oral (11/22 0504) BP: 138/97 mmHg (11/22 1031) Pulse Rate: 88 (11/22 1031)  Labs:  Recent Labs  06/01/15 1336  06/02/15 0530 06/03/15 0422 06/04/15 0824  HGB  --   < > 7.6* 8.3* 7.7*  HCT  --   --  23.6* 24.7* 24.4*  PLT  --   --  323 319 327  CREATININE  --   --  1.48* 1.57* 1.43*  TROPONINI <0.03  --   --   --   --   < > = values in this interval not displayed.  Estimated Creatinine Clearance: 65.9 mL/min (by C-G formula based on Cr of 1.43).   Medical History: Past Medical History  Diagnosis Date  . COPD (chronic obstructive pulmonary disease) (HCC)   . Diabetes mellitus without complication (HCC)   . Hypertension   . DVT (deep venous thrombosis) (HCC)   . Subdural hematoma (HCC)   . Paroxysmal atrial fibrillation (HCC)     Medications:  Scheduled:  . carvedilol  6.25 mg Oral BID  . cholecalciferol  2,000 Units Oral Daily  . diltiazem  360 mg Oral Daily  . gabapentin  300 mg Oral TID  . Influenza vac split quadrivalent PF  0.5 mL Intramuscular Tomorrow-1000  . levofloxacin  500 mg Oral Daily  . multivitamin with minerals  1 tablet Oral Daily  . niacin  250 mg Oral TID WC  . omega-3 acid ethyl esters  2 g Oral BID  . pravastatin  40 mg Oral QHS  . sodium chloride  3 mL Intravenous Q12H  . thiamine IV  100 mg Intravenous Daily  . vitamin B-12  1,000 mcg Oral Daily    Assessment: Pt is a 69 year old male found to have a DVT on US. Pharmacy consulted to dose apixaban  Goal of Therapy:   Monitor platelets by anticoagulation protocol: Yes   Plan:  Will start apixaban 10mg  bid for 7 days then 5mg  bid. Pharmacy to continue to follow.    Rayford Williamsen D Amen Staszak 06/04/2015,12:32 PM

## 2015-06-04 NOTE — Progress Notes (Signed)
Physical Therapy Treatment Patient Details Name: Thomas [name] MRN: [id number] DOB: 1946-05-11 Today's Date: 06/04/2015    History of Present Illness Pt here with syncope [facility].      PT Comments    Pt agreeable to PT; eager to go home. Reports pain in R knee especially with ambulation, but does not quantify. Bed mobility improved to Modified independent. With increased cueing/instruction, pt demonstrates improved safety with transfer. Pt ambulates 48 ft with improved control of right lower extremity with focus on quad set with heel strike and stance phase. Right lower extremity does not extend fully at knee and is subjectively very weak feeling. Pt would benefit from knee brace for improved safety with ambulation to prevent R knee buckling. Pt notes daughter bought pt a new rollator, which pt has never used before. Feel it would be best for pt to practice with rollator to demonstrate safety with use before determining if pt can safely go home. Pt lives in a mobile home with a ramp, do does not need to ambulate very far distances. Pt O2 saturation decreased to 94% on 3 L. Continue PT for progression of strength, endurance and safety with all functional mobility. Final recommendation for discharge to be determined based on consistency with transfer and ambulation quality, distance and safety.   Follow Up Recommendations  SNF (versus home with home health; see note)     Equipment Recommendations   (Pt's daughter bought pt a rollator)    Recommendations for Other Services       Precautions / Restrictions Precautions Precautions: Fall Restrictions Weight Bearing Restrictions: No    Mobility  Bed Mobility Overal bed mobility: Modified Independent Bed Mobility: Supine to Sit;Sit to Supine     Supine to sit: Modified independent (Device/Increase time);HOB elevated (use of rails; increased time/effort) Sit to supine: Modified independent (Device/Increase  time);HOB elevated (use of rails)   General bed mobility comments: use of rails/increased time effort to sit; requires rest after returning to bed before repositioning upward with use of rails   Transfers Overall transfer level: Needs assistance Equipment used: Rolling walker (2 wheeled) Transfers: Sit to/from Stand Sit to Stand: Min assist;+2 safety/equipment (cues for safe hand placement and to avoid impulsiveness)         General transfer comment: Pt strongly/cleary instructed on following instructions as given by therapist for safe transfer; pt complies  Ambulation/Gait Ambulation/Gait assistance: Min guard;+2 safety/equipment Ambulation Distance (Feet): 48 Feet Assistive device: Rolling walker (2 wheeled) Gait Pattern/deviations: Step-to pattern (encouraged to decrease R step length for less effort on RLE) Gait velocity: decreased Gait velocity interpretation: <1.8 ft/sec, indicative of risk for recurrent falls General Gait Details: R knee does not attain full extension with stance phase. Pt better able to control RLE with focus, but continues painful and weak feeling. Improved control with shortening R step   Stairs            Wheelchair Mobility    Modified Rankin (Stroke Patients Only)       Balance Overall balance assessment: Needs assistance         Standing balance support: Bilateral upper extremity supported Standing balance-Leahy Scale: Fair                      Cognition Arousal/Alertness: Awake/alert Behavior During Therapy: WFL for tasks assessed/performed Overall Cognitive Status: Within Functional Limits for tasks assessed  Exercises      General Comments        Pertinent Vitals/Pain Pain Assessment:  (R knee pain especially with ambulation; does not quantify) Pain Location: R knee Pain Descriptors / Indicators: Sharp;Aching Pain Intervention(s): Limited activity within patient's  tolerance;Monitored during session    Home Living                      Prior Function            PT Goals (current goals can now be found in the care plan section) Progress towards PT goals: Progressing toward goals    Frequency  Min 2X/week    PT Plan Current plan remains appropriate    Co-evaluation             End of Session Equipment Utilized During Treatment: Gait belt;Oxygen Activity Tolerance: Patient tolerated treatment well Patient left: in bed;with call bell/phone within reach;with bed alarm set     Time: 1610-9604 PT Time Calculation (min) (ACUTE ONLY): 26 min  Charges:  $Gait Training: 8-22 mins $Therapeutic Activity: 8-22 mins                    G Codes:      Kristeen Miss 06/04/2015, 4:29 PM

## 2015-06-04 NOTE — Plan of Care (Addendum)
Problem: Activity: Goal: Risk for activity intolerance will decrease Outcome: Progressing Patient continues to refuse SNF placement, very anxious to get home, venous US on leg today showed thrombus, vss, not going home today, APS came by today to speak with patient Dr. Daisy LazarKlapac is expected to come by to see patient as well to determine if he is competent in making decisions  Dr. Clent RidgesWalsh spoke to Patient late this evening planning to set up home health and discharge tomorrow

## 2015-06-04 NOTE — Plan of Care (Addendum)
Pt transferred from 2A late last night. Rested comfortably in bed, no c/o pain. Remains pleasantly confused.   Problem: Health Behavior/Discharge Planning: Goal: Ability to manage health-related needs will improve Outcome: Progressing High fall risk. Bed alarm on, hourly rounding with toileting offered.   Problem: Skin Integrity: Goal: Risk for impaired skin integrity will decrease Skin intact, generalized abrasions including on forehead from fall at home.   Problem: Activity: Goal: Risk for activity intolerance will decrease +2 assist to [facility]. Rest periods provided. Mobilization encouraged in bed.   Problem: Physical Regulation: Goal: Diagnostic test results will improve Outcome: Progressing Chest xray scheduled for today. IV Abx given as scheduled.  Goal: Ability to maintain a body temperature in the normal range will improve Outcome: Progressing VSS, afebrile.   Problem: Respiratory: Goal: Respiratory status will improve Outcome: Progressing O2 sats stable on 2L [place] which is new for the pt. Lung sounds diminished.

## 2015-06-04 NOTE — Discharge Summary (Addendum)
[facility] - [place] at [facility]  DISCHARGE SUMMARY   PATIENT NAME: Thomas [name]    MR#:  [id number]  DATE OF BIRTH:  14-May-1946  DATE OF ADMISSION:  05/31/2015 ADMITTING PHYSICIAN: Wyatt Haste, MD  DATE OF DISCHARGE: 06/05/2015  PRIMARY CARE PHYSICIAN: [place], [facility]   ADMISSION DIAGNOSIS:  Dehydration [E86.0] Bilateral pneumonia [J18.9] Acute renal failure, unspecified acute renal failure type (HCC) [N17.9] Syncope, unspecified syncope type [R55]  DISCHARGE DIAGNOSIS:  Active Problems:   Syncope   Acute kidney injury (HCC)   Healthcare-associated pneumonia   DVT of lower limb, acute (HCC)   Encephalopathy  SECONDARY DIAGNOSIS:   Past Medical History  Diagnosis Date  . COPD (chronic obstructive pulmonary disease) (HCC)   . Diabetes mellitus without complication (HCC)   . Hypertension   . DVT (deep venous thrombosis) (HCC)   . Subdural hematoma (HCC)   . Paroxysmal atrial fibrillation [facility])    HOSPITAL COURSE:  1. Healthcare associated pneumonia: Has had prolonged hospitalization at [facility] from 10:30 to 1118 for aspiration pneumonia in the setting of COPD. complicated by respiratory failure and sepsis requiring mechanical ventilation and pressor support and ICU stay. He was admitted here on 11/18 after fall at home with head trauma. His admission chest x-ray was concerning for heterogeneous opacities atelectasis versus pneumonia. He was encephalopathic and had a leukocytosis so there was concern for hospital acquired pneumonia. He was started on broad-spectrum antibiotics with vancomycin and Zosyn. Cultures have been negative. He has not had any respiratory distress or cough. He will complete a course of Levaquin after discharge. Repeat chest x-ray has shown no signs of pneumonia. Repeat chest x-ray does show concern for right midlung nodule, but he has had a recent chest CT scan which does not show this nodule. No follow-up  needed.  2. Right extremity DVT: This was initially diagnosed during his [facility] hospitalization. Seen again during this hospitalization. So far anticoagulation has been deferred due to concern over small subacute subdural hematomas found on MRI at [facility]. I have discussed this finding with our neurologist. As there has been no concern for intracranial bleeding on repeat CT scans he feels that these are stable that it would be safe to start anticoagulation. Note that the patient has had an IVC filter placed at [facility] and will need to follow up for possible IVC removal. He is being discharged on eliquis.  3. Acute kidney injury: Renal function improved. New baseline 1.4. He did have acute kidney injury during his [facility] hospitalization with a creatinine as high as 3.  4. Essential hypertension: The pressure has been well controlled on Coreg.   5. Paroxysmal atrial fibrillation: Continue with Cardizem and start eliquis.  6. altered mental status: Appreciate neurology and psychiatry consultation. Likely encephalopathy, though this may also be due to ongoing psychiatric condition. I have discussed with his wife who states that he is usually alert and oriented and without behavioral disturbance. Family denies history of alcohol abuse. Elevated sedimentation rate, though in setting of infection difficult to interpret. B12 and TSH are normal. He will need close monitoring 24 hour supervision.  7. Anemia: Hemoglobin has been stable. We'll need to monitor carefully as he is now on anticoagulation. There has been no evidence of bleeding. Reviewing his hemoglobin he had a significant drop during his [facility] hospitalization on 11/9 from 11.5-8.9. This is drifted downwards over time to 7.7. He does have renal disease which is likely contributing. Iron panel consistent  with anemia of chronic disease. His Hb is 7.8 today.  9. Deconditioning: Physical therapy recommendation is for skilled nursing. However the patient and his  family would like him to go home with home health services. [facility]  DISCHARGE CONDITIONS:   Fair  CONSULTS OBTAINED:  Treatment Team:  Wyatt Haste, MD Mellody Drown, MD Audery Amel, MD  DRUG ALLERGIES:  No Known Allergies  DISCHARGE MEDICATIONS:   Current Discharge Medication List    START taking these medications   Details  !! apixaban (ELIQUIS) 5 MG TABS tablet Take 2 tablets (10 mg total) by mouth 2 (two) times daily. Qty: 14 tablet, Refills: 0    !! apixaban (ELIQUIS) 5 MG TABS tablet Take 1 tablet (5 mg total) by mouth 2 (two) times daily. Qty: 60 tablet, Refills: 3    cholecalciferol 2000 UNITS TABS Take 1 tablet (2,000 Units total) by mouth daily. Qty: 30 tablet, Refills: 0    levofloxacin (LEVAQUIN) 500 MG tablet Take 1 tablet (500 mg total) by mouth daily. Qty: 4 tablet, Refills: 0     !! - Potential duplicate medications found. Please discuss with provider.    CONTINUE these medications which have NOT CHANGED   Details  carvedilol (COREG) 6.25 MG tablet Take 6.25 mg by mouth 2 (two) times daily.    diltiazem (CARDIZEM CD) 360 MG 24 hr capsule Take 360 mg by mouth daily.    gabapentin (NEURONTIN) 300 MG capsule Take 300 mg by mouth 3 (three) times daily.    Melatonin 3 MG TABS Take 3 mg by mouth at bedtime as needed (for sleep).    Multiple Vitamin (MULTIVITAMIN WITH MINERALS) TABS tablet Take 1 tablet by mouth daily.    niacin 100 MG tablet Take 300 mg by mouth 3 (three) times daily with meals.    omega-3 acid ethyl esters (LOVAZA) 1 G capsule Take 2 g by mouth 2 (two) times daily.    potassium chloride SA (K-DUR,KLOR-CON) 20 MEQ tablet Take 20 mEq by mouth daily.    pravastatin (PRAVACHOL) 40 MG tablet Take 40 mg by mouth at bedtime.    Pyridoxine HCl (VITAMIN B-6) 500 MG tablet Take 1,000 mg by mouth daily.    thiamine (VITAMIN B-1) 100 MG tablet Take 100 mg by mouth daily.    vitamin B-12 (CYANOCOBALAMIN) 1000  MCG tablet Take 1,000 mcg by mouth daily.    Vitamin D-Vitamin K (VITAMIN K2-VITAMIN D3) 45-2000 MCG-UNIT CAPS Take 1 capsule by mouth daily.      STOP taking these medications     albuterol (PROVENTIL HFA;VENTOLIN HFA) 108 (90 BASE) MCG/ACT inhaler          DISCHARGE INSTRUCTIONS:    Home with home health physical therapy, nursing and nursing aide. Heart healthy diet.  If you experience worsening of your admission symptoms, develop shortness of breath, life threatening emergency, suicidal or homicidal thoughts you must seek medical attention immediately by calling 911 or calling your MD immediately  if symptoms less severe.  You Must read complete instructions/literature along with all the possible adverse reactions/side effects for all the Medicines you take and that have been prescribed to you. Take any new Medicines after you have completely understood and accept all the possible adverse reactions/side effects.   Please note  You were cared for by a hospitalist during your hospital stay. If you have any questions about your discharge medications or the care you received while you were in the  hospital after you are discharged, you can call the unit and asked to speak with the hospitalist on call if the hospitalist that took care of you is not available. Once you are discharged, your primary care physician will handle any further medical issues. Please note that NO REFILLS for any discharge medications will be authorized once you are discharged, as it is imperative that you return to your primary care physician (or establish a relationship with a primary care physician if you do not have one) for your aftercare needs so that they can reassess your need for medications and monitor your lab values.  Today   CHIEF COMPLAINT:   Chief Complaint  Patient presents with  . Loss of Consciousness    HISTORY OF PRESENT ILLNESS:  Caymen Dubray is a 69 y.o. male with a known history of recent  DVT status post IVC filter placements, who is presenting after syncopal episode. The patient is unable to provide meaningful information given mental status/medical condition majority of history Obtained from ED provider as well as prior documentation. Patient recently finished an extensive course in [facility] admitted 05/12/2015 after her fall course complicated by aspiration pneumonia, he required intubation/mechanical ventilation-completed full course of antibiotics. He is also found to have a DVT, subdural hematoma, an IVC filter placed. He was transferred out of the intensive care 05/30/2015 where he was noted to have altered mental status described as confusion. Despite this the patient and family decided to leave AGAINST MEDICAL ADVICE 05/31/2015. While at home at syncopal episode from standing with head trauma. On EMS arrival noted to be hypotensive systolic blood pressure in the 70s. When asked the patient how he is doing he states "I'm a human being and pretty happy about that" followed by incoherent babble.  He feels much better and is wanting to go home.  His daughter is at bedside.  They were explained all the risks and benefit of anticoagulation, especially with him having history of hematoma.  He is certainly at high risk.  His hemoglobin is 7.8 and will need to be closely monitored. He is currently stable and is discharged home in stable condition.he is agreeable with the discharge plans VITAL SIGNS:  Blood pressure 110/63, pulse 80, temperature 98.5 F (36.9 C), temperature source Oral, resp. rate 20, height  (1.854 m), weight 115.939 kg (255 lb 9.6 oz), SpO2 97 %. PHYSICAL EXAMINATION:  GENERAL:  69 y.o.-year-old patient sitting up in chair, no acute distress  LUNGS: Normal breath sounds bilaterally, no wheezing, rales,rhonchi or crepitation. No use of accessory muscles of respiration.  CARDIOVASCULAR: S1, S2 normal. No murmurs, rubs, or gallops.  ABDOMEN: Soft, non-tender,  non-distended. Bowel sounds present. No organomegaly or mass. No guarding no rebound EXTREMITIES: +1 right pedal edema, no cyanosis, or clubbing.  NEUROLOGIC: Cranial nerves II through XII are intact. Muscle strength 5/5 in all extremities. Sensation intact. Gait not checked.  PSYCHIATRIC: The patient is alert and oriented x 3.  SKIN: No obvious rash, lesion, or ulcer.  DATA REVIEW:   CBC  Recent Labs Lab 06/04/15 0824  WBC 11.2*  HGB 7.7*  HCT 24.4*  PLT 327    Chemistries   Recent Labs Lab 06/04/15 0824  NA 137  K 3.6  CL 106  CO2 23  GLUCOSE 119*  BUN 16  CREATININE 1.43*  CALCIUM 8.5*    Cardiac Enzymes  Recent Labs Lab 06/01/15 1336  TROPONINI <0.03    Microbiology Results  Results for orders placed or performed  during the hospital encounter of 05/31/15  Culture, blood (routine x 2)     Status: None (Preliminary result)   Collection Time: 05/31/15  9:20 PM  Result Value Ref Range Status   Specimen Description BLOOD LEFT ASSIST CONTROL  Final   Special Requests BOTTLES DRAWN AEROBIC AND ANAEROBIC 4CC  Final   Culture  Setup Time   Final    GRAM POSITIVE RODS AEROBIC BOTTLE ONLY CRITICAL RESULT CALLED TO, READ BACK BY AND VERIFIED WITH: MARCEL TURNER AT 2353 ON 06/01/15 [facility] CONFIRMED BY [facility]    Culture   Final    GRAM POSITIVE RODS AEROBIC BOTTLE ONLY Results consistent with contamination.    Report Status PENDING  Incomplete  Culture, blood (routine x 2)     Status: None (Preliminary result)   Collection Time: 05/31/15  9:20 PM  Result Value Ref Range Status   Specimen Description BLOOD RIGHT ASSIST CONTROL  Final   Special Requests BOTTLES DRAWN AEROBIC AND ANAEROBIC 4CC  Final   Culture NO GROWTH 2 DAYS  Final   Report Status PENDING  Incomplete    RADIOLOGY:  Dg Chest 2 View  06/04/2015  CLINICAL DATA:  Congestion, weakness, COPD EXAM: CHEST  2 VIEW COMPARISON:  Portable chest x-ray of 05/30/2014 FINDINGS: There is mild atelectasis or  scarring at the lung bases. The lungs are somewhat hyperaerated. No active infiltrate or effusion is seen. There is a poorly defined nodular opacity in the right perihilar region. This could simply represent overlapping vasculature, but a developing nodule cannot be excluded. Recommend either followup chest x-ray or CT of the chest to assess further. Mediastinal and hilar contours are unremarkable. The heart is mildly enlarged and stable. The bones are osteopenic and there are diffuse degenerative changes throughout the thoracic spine. IMPRESSION: 1. Hyperaeration with probable basilar atelectasis or scarring. 2. Poorly defined nodular opacity in the right mid lung -perihilar region possibly due to overlapping vasculature, but consider follow-up chest x-ray or CT of the chest to assess further. Electronically Signed   By: Dwyane Dee M.D.   On: 06/04/2015 08:08   US Venous Img Lower Unilateral Right  06/04/2015  CLINICAL DATA:  Right lower extremity edema for the past week. History of prior DVT. Evaluate for acute or chronic DVT. EXAM: RIGHT LOWER EXTREMITY VENOUS DOPPLER ULTRASOUND TECHNIQUE: Gray-scale sonography with graded compression, as well as color Doppler and duplex ultrasound were performed to evaluate the lower extremity deep venous systems from the level of the common femoral vein and including the common femoral, femoral, profunda femoral, popliteal and calf veins including the posterior tibial, peroneal and gastrocnemius veins when visible. The superficial great saphenous vein was also interrogated. Spectral Doppler was utilized to evaluate flow at rest and with distal augmentation maneuvers in the common femoral, femoral and popliteal veins. COMPARISON:  None. FINDINGS: Contralateral Common Femoral Vein: Respiratory phasicity is normal and symmetric with the symptomatic side. No evidence of thrombus. Normal compressibility. Common Femoral Vein: There is mixed echogenic nonocclusive thrombus within  common femoral vein (images 12 and 21). Saphenofemoral Junction: No evidence of thrombus. Normal compressibility and flow on color Doppler imaging. Profunda Femoral Vein: No evidence of thrombus. Normal compressibility and flow on color Doppler imaging. Femoral Vein: No evidence of thrombus. Normal compressibility, respiratory phasicity and response to augmentation. Popliteal Vein: No evidence of thrombus. Normal compressibility, respiratory phasicity and response to augmentation. Calf Veins: There is hypoechoic expansile occlusive thrombus within the right posterior tibial (image 19 and 41) and  peroneal veins (image 42). Superficial Great Saphenous Vein: No evidence of thrombus. Normal compressibility and flow on color Doppler imaging. Venous Reflux:  None. Other Findings: Within the superficial tissues about the popliteal fossa is a serpiginous anechoic approximately 2.1 x 0.8 x 3.1 cm fluid collection (representative images 2 and 3). IMPRESSION: 1. The examination is positive for age indeterminate nonocclusive thrombus within the right common femoral vein as well as age-indeterminate occlusive thrombus within the right posterior tibial and peroneal veins. 2. Approximately 3.1 cm anechoic fluid collection within the superficial soft tissues about the right popliteal fossa, nonspecific though likely representative of a leaking Baker's cyst. Clinical correlation is advised. These results will be called to the ordering clinician or representative by the Radiologist Assistant, and communication documented in the PACS or zVision Dashboard. Electronically Signed   By: Simonne ComeJohn  Watts M.D.   On: 06/04/2015 12:06    EKG:   Orders placed or performed during the hospital encounter of 05/31/15  . ED EKG  . ED EKG  . EKG 12-Lead  . EKG 12-Lead   Management plans discussed with the patient, family and they are in agreement.  CODE STATUS:     Code Status Orders        Start     Ordered   05/31/15 2331  Full  code   Continuous     05/31/15 2331      TOTAL TIME TAKING CARE OF THIS PATIENT: 45 minutes.   Greater than 50% of time spent in care coordination and counseling.  Elby ShowersWALSH, CATHERINE M.D on 06/04/2015 at 4:38 PM  Between 7am to 6pm - Pager - [phone]  After 6pm go to [email or web address] - password [email or web address] [facility]  [facility]  Office  [phone]  CC: Primary care physician; [facility]

## 2015-06-04 NOTE — Consult Note (Signed)
  Psychiatry: Consult received. Chart reviewed. I will follow up with the patient tomorrow morning for more complete consult. Case discussed with hospitalist

## 2015-06-04 NOTE — Progress Notes (Signed)
[facility] [place] at [facility]   PATIENT NAME: Thomas [name]    MR#:  [id number]  DATE OF BIRTH:  September 09, 1945  SUBJECTIVE:  CHIEF COMPLAINT:   Chief Complaint  Patient presents with  . Loss of Consciousness   Much more alert today. No new complaints.  REVIEW OF SYSTEMS:   ROS denies any symptoms states that he is "fine"  DRUG ALLERGIES:  No Known Allergies  VITALS:  Blood pressure 110/63, pulse 80, temperature 98.5 F (36.9 C), temperature source Oral, resp. rate 20, height  (1.854 m), weight 115.939 kg (255 lb 9.6 oz), SpO2 97 %.  PHYSICAL EXAMINATION:  GENERAL: 69 y.o.-year-old patient sitting up in chair, no acute distress  LUNGS: Normal breath sounds bilaterally, no wheezing, rales,rhonchi or crepitation. No use of accessory muscles of respiration.  CARDIOVASCULAR: S1, S2 normal. No murmurs, rubs, or gallops.  ABDOMEN: Soft, non-tender, non-distended. Bowel sounds present. No organomegaly or mass. No guarding no rebound EXTREMITIES: +1 right pedal edema, no cyanosis, or clubbing.  NEUROLOGIC: Cranial nerves II through XII are intact. Muscle strength 5/5 in all extremities. Sensation intact. Gait not checked.  PSYCHIATRIC: The patient is alert and oriented x 3.  SKIN: No obvious rash, lesion, or ulcer.   LABORATORY PANEL:   CBC  Recent Labs Lab 06/04/15 0824  WBC 11.2*  HGB 7.7*  HCT 24.4*  PLT 327   ------------------------------------------------------------------------------------------------------------------  Chemistries   Recent Labs Lab 06/04/15 0824  NA 137  K 3.6  CL 106  CO2 23  GLUCOSE 119*  BUN 16  CREATININE 1.43*  CALCIUM 8.5*   ------------------------------------------------------------------------------------------------------------------  Cardiac Enzymes  Recent Labs Lab 06/01/15 1336  TROPONINI <0.03    ------------------------------------------------------------------------------------------------------------------  RADIOLOGY:  Dg Chest 2 View  06/04/2015  CLINICAL DATA:  Congestion, weakness, COPD EXAM: CHEST  2 VIEW COMPARISON:  Portable chest x-ray of 05/30/2014 FINDINGS: There is mild atelectasis or scarring at the lung bases. The lungs are somewhat hyperaerated. No active infiltrate or effusion is seen. There is a poorly defined nodular opacity in the right perihilar region. This could simply represent overlapping vasculature, but a developing nodule cannot be excluded. Recommend either followup chest x-ray or CT of the chest to assess further. Mediastinal and hilar contours are unremarkable. The heart is mildly enlarged and stable. The bones are osteopenic and there are diffuse degenerative changes throughout the thoracic spine. IMPRESSION: 1. Hyperaeration with probable basilar atelectasis or scarring. 2. Poorly defined nodular opacity in the right mid lung -perihilar region possibly due to overlapping vasculature, but consider follow-up chest x-ray or CT of the chest to assess further. Electronically Signed   By: Dwyane Dee M.D.   On: 06/04/2015 08:08   US Venous Img Lower Unilateral Right  06/04/2015  CLINICAL DATA:  Right lower extremity edema for the past week. History of prior DVT. Evaluate for acute or chronic DVT. EXAM: RIGHT LOWER EXTREMITY VENOUS DOPPLER ULTRASOUND TECHNIQUE: Gray-scale sonography with graded compression, as well as color Doppler and duplex ultrasound were performed to evaluate the lower extremity deep venous systems from the level of the common femoral vein and including the common femoral, femoral, profunda femoral, popliteal and calf veins including the posterior tibial, peroneal and gastrocnemius veins when visible. The superficial great saphenous vein was also interrogated. Spectral Doppler was utilized to evaluate flow at rest and with distal augmentation  maneuvers in the common femoral, femoral and popliteal veins. COMPARISON:  None. FINDINGS: Contralateral Common Femoral Vein: Respiratory phasicity is  normal and symmetric with the symptomatic side. No evidence of thrombus. Normal compressibility. Common Femoral Vein: There is mixed echogenic nonocclusive thrombus within common femoral vein (images 12 and 21). Saphenofemoral Junction: No evidence of thrombus. Normal compressibility and flow on color Doppler imaging. Profunda Femoral Vein: No evidence of thrombus. Normal compressibility and flow on color Doppler imaging. Femoral Vein: No evidence of thrombus. Normal compressibility, respiratory phasicity and response to augmentation. Popliteal Vein: No evidence of thrombus. Normal compressibility, respiratory phasicity and response to augmentation. Calf Veins: There is hypoechoic expansile occlusive thrombus within the right posterior tibial (image 19 and 41) and peroneal veins (image 42). Superficial Great Saphenous Vein: No evidence of thrombus. Normal compressibility and flow on color Doppler imaging. Venous Reflux:  None. Other Findings: Within the superficial tissues about the popliteal fossa is a serpiginous anechoic approximately 2.1 x 0.8 x 3.1 cm fluid collection (representative images 2 and 3). IMPRESSION: 1. The examination is positive for age indeterminate nonocclusive thrombus within the right common femoral vein as well as age-indeterminate occlusive thrombus within the right posterior tibial and peroneal veins. 2. Approximately 3.1 cm anechoic fluid collection within the superficial soft tissues about the right popliteal fossa, nonspecific though likely representative of a leaking Baker's cyst. Clinical correlation is advised. These results will be called to the ordering clinician or representative by the Radiologist Assistant, and communication documented in the PACS or zVision Dashboard. Electronically Signed   By: Simonne ComeJohn  Watts M.D.   On: 06/04/2015  12:06    EKG:   Orders placed or performed during the hospital encounter of 05/31/15  . ED EKG  . ED EKG  . EKG 12-Lead  . EKG 12-Lead    ASSESSMENT AND PLAN:    1. Healthcare associated pneumonia: Has [facility] from 10:30 to 1118 for aspiration pneumonia in the setting of COPD. complicated by respiratory failure and sepsis requiring mechanical ventilation and pressor support and ICU stay. He was admitted here on 11/18 after fall at home with head trauma. His admission chest x-ray was concerning for heterogeneous opacities atelectasis versus pneumonia. He was encephalopathic and had a leukocytosis so there was concern for hospital acquired pneumonia. He was started on broad-spectrum antibiotics with vancomycin and Zosyn. Cultures have been negative. He has not had any respiratory distress or cough. He will complete a course of Levaquin after discharge. Repeat chest x-ray has shown no signs of pneumonia. Repeat chest x-ray does show concern for right midlung nodule, but he has had a recent chest CT scan which does not show this nodule. No follow-up needed.  2. Right extremity DVT: This was initially diagnosed during his [facility] hospitalization. Seen again during this hospitalization. So far anticoagulation has been deferred due to concern over small subacute subdural hematomas [facility]. I have discussed this finding with our neurologist. As there has been no concern for intracranial bleeding on repeat CT scans he feels that these are stable that it would be safe to start anticoagulation. Note that the patient has had an IVC filter placed at [facility] and will need to follow up for possible IVC removal. He will be discharged on eliquis.  3. Acute kidney injury: Renal function improved. New baseline 1.4. He did have acute kidney injury during his [facility] hospitalization with a creatinine as high as 3.  4. Essential hypertension: The pressure has been well controlled  on Coreg.   5. Paroxysmal atrial fibrillation: Continue with Cardizem and start eliquis.  6.  altered mental status: Appreciate neurology and psychiatry consultation. Likely encephalopathy, though this may also be due to ongoing psychiatric condition. I have discussed with his wife who states that he is usually alert and oriented and without behavioral disturbance. Family denies history of alcohol abuse. Elevated sedimentation rate, though in setting of infection difficult to interpret. B12 and TSH are normal. He will need close monitoring 24 hour supervision.  7. Anemia: Hemoglobin has been stable. We'll need to monitor carefully as he is now on anticoagulation. There has been no evidence of bleeding. Reviewing his hemoglobin he had a significant drop during his [facility] hospitalization on 11/9 from 11.5-8.9. This is drifted downwards over time to 7.7. He does have renal disease which is likely contributing. Iron panel consistent with anemia of chronic disease.  9. Deconditioning: Physical therapy recommendation is for skilled nursing. However the patient and his family would like him to go home with home health services.  CODE STATUS: Full  TOTAL TIME TAKING CARE OF THIS PATIENT: 35 minutes.  Greater than 50% of time spent in care coordination and counseling. POSSIBLE D/C IN 2 DAYS, DEPENDING ON CLINICAL CONDITION.  Unfortunately patient unable to discharge today. We will need to arrange home health services through the [place]. This will need to be done by care management in the morning. Also psychiatry evaluation is pending. I have discussed this with Dr. Mat Carne PACS but he has not yet seen the patient today. Please see note from social worker that patient is being followed by DSS.  Elby Showers M.D on 06/04/2015 at 5:25 PM  Between 7am to 6pm - Pager - [phone]  After 6pm go to [email or web address] - password [email or web address] [facility]  [facility]  Office  [phone]  CC: Primary care  physician; No PCP Per Patient

## 2015-06-05 LAB — COMPREHENSIVE METABOLIC PANEL
ALBUMIN: 2.6 g/dL — AB (ref 3.5–5.0)
ALT: 29 U/L (ref 17–63)
ANION GAP: 7 (ref 5–15)
AST: 21 U/L (ref 15–41)
Alkaline Phosphatase: 56 U/L (ref 38–126)
BUN: 14 mg/dL (ref 6–20)
CO2: 26 mmol/L (ref 22–32)
Calcium: 8.6 mg/dL — ABNORMAL LOW (ref 8.9–10.3)
Chloride: 108 mmol/L (ref 101–111)
Creatinine, Ser: 1.19 mg/dL (ref 0.61–1.24)
GFR calc non Af Amer: >60 mL/min (ref >60)
GLUCOSE: 113 mg/dL — AB (ref 65–99)
POTASSIUM: 3.7 mmol/L (ref 3.5–5.1)
SODIUM: 141 mmol/L (ref 135–145)
Total Bilirubin: 0.4 mg/dL (ref 0.3–1.2)
Total Protein: 6.6 g/dL (ref 6.5–8.1)

## 2015-06-05 LAB — CULTURE, BLOOD (ROUTINE X 2): CULTURE: NO GROWTH

## 2015-06-05 LAB — CBC
HEMATOCRIT: 24.2 % — AB (ref 40.0–52.0)
HEMOGLOBIN: 7.8 g/dL — AB (ref 13.0–18.0)
MCH: 29.6 pg (ref 26.0–34.0)
MCHC: 32.3 g/dL (ref 32.0–36.0)
MCV: 91.6 fL (ref 80.0–100.0)
Platelets: 293 10*3/uL (ref 150–440)
RBC: 2.64 MIL/uL — AB (ref 4.40–5.90)
RDW: 15.6 % — ABNORMAL HIGH (ref 11.5–14.5)
WBC: 10.8 10*3/uL — ABNORMAL HIGH (ref 3.8–10.6)

## 2015-06-05 NOTE — Plan of Care (Signed)
Problem: Respiratory: Goal: Identification of resources available to assist in meeting health care needs will improve Outcome: [facility] with 2 person assist. No complaints of pain. O2 at 2 liters.

## 2015-06-05 NOTE — Consult Note (Signed)
  Psychiatry: Came by to see patient for consult to follow-up on yesterday. Patient has already been discharged and has left the hospital.

## 2015-06-05 NOTE — Clinical Social Work Note (Signed)
Clinical Social Worker updated [employer] [place] [facility] APS worker of pt's discharge. CSW is signing off as no further needs identified.   Dede QuerySarah Eriyonna Matsushita, MSW, LCSW Clinical Social Worker  [phone]

## 2015-06-05 NOTE — Care Management (Addendum)
Telephone call to Dr. Zelphia CairoWhelan's office. [phone]). Spoke with Nurse Byrd HesselbachMaria. All notes and requested have been entered into there system. Dr. Barnetta ChapelWhelan will review and sign. Will fax face sheet, discharge summary, face-to-face and home health orders to [phone]. Ordered nursing, physical therapy, occupational therapy, Child psychotherapistsocial worker, and aide for home services.  Received telephone call from Nurse Byrd HesselbachMaria. States that [facility] Nurse indicated that these services will need to be arranged from this hospital and [facility] will pay. Will send referral to [facility]. Discharge to home today per Dr. Sherryll BurgerShah. Thomas GreetBrenda S Madisynn Plair RN MSN CCM Care Management [phone]

## 2015-06-05 NOTE — Discharge Instructions (Signed)
Community-Acquired Pneumonia, Adult Pneumonia is an infection of the lungs. One type of pneumonia can happen while a person is in a hospital. A different type can happen when a person is not in a hospital (community-acquired pneumonia). It is easy for this kind to spread from person to person. It can spread to you if you breathe near an infected person who coughs or sneezes. Some symptoms include:  A dry cough.  A wet (productive) cough.  Fever.  Sweating.  Chest pain. HOME CARE  Take over-the-counter and prescription medicines only as told by your doctor.  Only take cough medicine if you are losing sleep.  If you were prescribed an antibiotic medicine, take it as told by your doctor. Do not stop taking the antibiotic even if you start to feel better.  Sleep with your head and neck raised (elevated). You can do this by putting a few pillows under your head, or you can sleep in a recliner.  Do not use tobacco products. These include cigarettes, chewing tobacco, and e-cigarettes. If you need help quitting, ask your doctor.  Drink enough water to keep your pee (urine) clear or pale yellow. A shot (vaccine) can help prevent pneumonia. Shots are often suggested for:  People older than 69 years of age.  People older than 69 years of age:  Who are having cancer treatment.  Who have long-term (chronic) lung disease.  Who have problems with their body's defense system (immune system). You may also prevent pneumonia if you take these actions:  Get the flu (influenza) shot every year.  Go to the dentist as often as told.  Wash your hands often. If soap and water are not available, use hand sanitizer. GET HELP IF:  You have a fever.  You lose sleep because your cough medicine does not help. GET HELP RIGHT AWAY IF:  You are short of breath and it gets worse.  You have more chest pain.  Your sickness gets worse. This is very serious if:  You are an older adult.  Your  body's defense system is weak.  You cough up blood.   This information is not intended to replace advice given to you by your health care provider. Make sure you discuss any questions you have with your health care provider.   Document Released: 12/16/2007 Document Revised: 03/20/2015 Document Reviewed: 10/24/2014 Elsevier Interactive Patient Education 2016 [employer]  [facility] tomorrow to make appointment and to discuss medications. You should have home health nursing, physical therapy and a home health aide

## 2015-06-10 ENCOUNTER — Inpatient Hospital Stay
Admission: EM | Admit: 2015-06-10 | Discharge: 2015-06-16 | DRG: 208 | Payer: Non-veteran care | Attending: Internal Medicine | Admitting: Internal Medicine

## 2015-06-10 ENCOUNTER — Emergency Department: Payer: Non-veteran care

## 2015-06-10 ENCOUNTER — Encounter: Payer: Self-pay | Admitting: Emergency Medicine

## 2015-06-10 DIAGNOSIS — Z79899 Other long term (current) drug therapy: Secondary | ICD-10-CM

## 2015-06-10 DIAGNOSIS — N329 Bladder disorder, unspecified: Secondary | ICD-10-CM | POA: Diagnosis present

## 2015-06-10 DIAGNOSIS — I10 Essential (primary) hypertension: Secondary | ICD-10-CM | POA: Diagnosis present

## 2015-06-10 DIAGNOSIS — J159 Unspecified bacterial pneumonia: Secondary | ICD-10-CM | POA: Diagnosis present

## 2015-06-10 DIAGNOSIS — Z823 Family history of stroke: Secondary | ICD-10-CM

## 2015-06-10 DIAGNOSIS — J449 Chronic obstructive pulmonary disease, unspecified: Secondary | ICD-10-CM | POA: Insufficient documentation

## 2015-06-10 DIAGNOSIS — N359 Urethral stricture, unspecified: Secondary | ICD-10-CM | POA: Diagnosis present

## 2015-06-10 DIAGNOSIS — Y95 Nosocomial condition: Secondary | ICD-10-CM | POA: Diagnosis present

## 2015-06-10 DIAGNOSIS — R0902 Hypoxemia: Secondary | ICD-10-CM

## 2015-06-10 DIAGNOSIS — J441 Chronic obstructive pulmonary disease with (acute) exacerbation: Secondary | ICD-10-CM | POA: Diagnosis present

## 2015-06-10 DIAGNOSIS — E119 Type 2 diabetes mellitus without complications: Secondary | ICD-10-CM

## 2015-06-10 DIAGNOSIS — Z955 Presence of coronary angioplasty implant and graft: Secondary | ICD-10-CM

## 2015-06-10 DIAGNOSIS — Z86718 Personal history of other venous thrombosis and embolism: Secondary | ICD-10-CM

## 2015-06-10 DIAGNOSIS — N1339 Other hydronephrosis: Secondary | ICD-10-CM | POA: Insufficient documentation

## 2015-06-10 DIAGNOSIS — N35919 Unspecified urethral stricture, male, unspecified site: Secondary | ICD-10-CM | POA: Insufficient documentation

## 2015-06-10 DIAGNOSIS — I480 Paroxysmal atrial fibrillation: Secondary | ICD-10-CM | POA: Diagnosis present

## 2015-06-10 DIAGNOSIS — N179 Acute kidney failure, unspecified: Secondary | ICD-10-CM | POA: Diagnosis present

## 2015-06-10 DIAGNOSIS — G4733 Obstructive sleep apnea (adult) (pediatric): Secondary | ICD-10-CM | POA: Diagnosis present

## 2015-06-10 DIAGNOSIS — E1122 Type 2 diabetes mellitus with diabetic chronic kidney disease: Secondary | ICD-10-CM | POA: Diagnosis present

## 2015-06-10 DIAGNOSIS — I129 Hypertensive chronic kidney disease with stage 1 through stage 4 chronic kidney disease, or unspecified chronic kidney disease: Secondary | ICD-10-CM | POA: Diagnosis present

## 2015-06-10 DIAGNOSIS — Z8249 Family history of ischemic heart disease and other diseases of the circulatory system: Secondary | ICD-10-CM

## 2015-06-10 DIAGNOSIS — D649 Anemia, unspecified: Secondary | ICD-10-CM | POA: Diagnosis present

## 2015-06-10 DIAGNOSIS — Z87891 Personal history of nicotine dependence: Secondary | ICD-10-CM

## 2015-06-10 DIAGNOSIS — Z809 Family history of malignant neoplasm, unspecified: Secondary | ICD-10-CM

## 2015-06-10 DIAGNOSIS — J189 Pneumonia, unspecified organism: Secondary | ICD-10-CM

## 2015-06-10 DIAGNOSIS — I2510 Atherosclerotic heart disease of native coronary artery without angina pectoris: Secondary | ICD-10-CM | POA: Diagnosis present

## 2015-06-10 DIAGNOSIS — N1330 Unspecified hydronephrosis: Secondary | ICD-10-CM | POA: Diagnosis present

## 2015-06-10 DIAGNOSIS — J45909 Unspecified asthma, uncomplicated: Secondary | ICD-10-CM | POA: Diagnosis present

## 2015-06-10 DIAGNOSIS — Z8701 Personal history of pneumonia (recurrent): Secondary | ICD-10-CM

## 2015-06-10 DIAGNOSIS — N189 Chronic kidney disease, unspecified: Secondary | ICD-10-CM | POA: Diagnosis present

## 2015-06-10 DIAGNOSIS — J440 Chronic obstructive pulmonary disease with acute lower respiratory infection: Principal | ICD-10-CM | POA: Diagnosis present

## 2015-06-10 HISTORY — DX: Unspecified asthma, uncomplicated: J45.909

## 2015-06-10 LAB — COMPREHENSIVE METABOLIC PANEL
ALBUMIN: 2.7 g/dL — AB (ref 3.5–5.0)
ALT: 26 U/L (ref 17–63)
ANION GAP: 9 (ref 5–15)
AST: 30 U/L (ref 15–41)
Alkaline Phosphatase: 64 U/L (ref 38–126)
BILIRUBIN TOTAL: 0.6 mg/dL (ref 0.3–1.2)
BUN: 10 mg/dL (ref 6–20)
CHLORIDE: 101 mmol/L (ref 101–111)
CO2: 27 mmol/L (ref 22–32)
Calcium: 8.6 mg/dL — ABNORMAL LOW (ref 8.9–10.3)
Creatinine, Ser: 1.33 mg/dL — ABNORMAL HIGH (ref 0.61–1.24)
GFR calc Af Amer: >60 mL/min (ref >60)
GFR calc non Af Amer: 53 mL/min — ABNORMAL LOW (ref >60)
GLUCOSE: 99 mg/dL (ref 65–99)
POTASSIUM: 4 mmol/L (ref 3.5–5.1)
SODIUM: 137 mmol/L (ref 135–145)
TOTAL PROTEIN: 7.1 g/dL (ref 6.5–8.1)

## 2015-06-10 LAB — CBC WITH DIFFERENTIAL/PLATELET
BASOS ABS: 0.1 10*3/uL (ref 0–0.1)
BASOS PCT: 1 %
EOS ABS: 0.2 10*3/uL (ref 0–0.7)
EOS PCT: 2 %
HCT: 26.7 % — ABNORMAL LOW (ref 40.0–52.0)
Hemoglobin: 8.5 g/dL — ABNORMAL LOW (ref 13.0–18.0)
Lymphocytes Relative: 12 %
Lymphs Abs: 1.1 10*3/uL (ref 1.0–3.6)
MCH: 28.2 pg (ref 26.0–34.0)
MCHC: 31.8 g/dL — ABNORMAL LOW (ref 32.0–36.0)
MCV: 88.7 fL (ref 80.0–100.0)
MONO ABS: 0.9 10*3/uL (ref 0.2–1.0)
Monocytes Relative: 9 %
Neutro Abs: 7.1 10*3/uL — ABNORMAL HIGH (ref 1.4–6.5)
Neutrophils Relative %: 76 %
PLATELETS: 234 10*3/uL (ref 150–440)
RBC: 3.01 MIL/uL — ABNORMAL LOW (ref 4.40–5.90)
RDW: 16.2 % — AB (ref 11.5–14.5)
WBC: 9.3 10*3/uL (ref 3.8–10.6)

## 2015-06-10 LAB — TROPONIN I

## 2015-06-10 LAB — LACTIC ACID, PLASMA: LACTIC ACID, VENOUS: 2 mmol/L (ref 0.5–2.0)

## 2015-06-10 LAB — PROTIME-INR
INR: 1.34
Prothrombin Time: 16.7 seconds — ABNORMAL HIGH (ref 11.4–15.0)

## 2015-06-10 LAB — BRAIN NATRIURETIC PEPTIDE: B Natriuretic Peptide: 184 pg/mL — ABNORMAL HIGH (ref 0.0–100.0)

## 2015-06-10 MED ORDER — IOHEXOL 350 MG/ML SOLN
75.0000 mL | Freq: Once | INTRAVENOUS | Status: AC | PRN
  Administered 2015-06-10: 75 mL via INTRAVENOUS

## 2015-06-10 MED ORDER — VANCOMYCIN HCL IN DEXTROSE 1-5 GM/200ML-% IV SOLN
1000.0000 mg | Freq: Once | INTRAVENOUS | Status: AC
  Administered 2015-06-10: 1000 mg via INTRAVENOUS
  Filled 2015-06-10: qty 200

## 2015-06-10 MED ORDER — PIPERACILLIN-TAZOBACTAM 3.375 G IVPB
3.3750 g | Freq: Once | INTRAVENOUS | Status: AC
  Administered 2015-06-10: 3.375 g via INTRAVENOUS
  Filled 2015-06-10: qty 50

## 2015-06-10 NOTE — ED Notes (Addendum)
Pt up to restroom with assistance. Pt answering questions without difficulty. Pt tolerated well.

## 2015-06-10 NOTE — ED Provider Notes (Addendum)
[facility] Emergency Department Provider Note  ____________________________________________  Time seen: Approximately 7:39 PM  I have reviewed the triage vital signs and the nursing notes.   HISTORY  Chief Complaint Shortness of Breath    HPI Thomas [name] is a 69 y.o. male patient recently hospitalized for pneumonia. He has finished all of his antibiotics. He is however short of breath and coughing has a dry cough. His O2 sats at home are 70%. Here his O2 sats were low until he was put on oxygen. At 3 L he is running about 97%. This goes up and down slightly. Patient also has an IVC filter in and has bilateral leg edema. The edema in his legs is not really any different than usual. Patient has had blood clots as I understand it is in spite of filter. Patient is not currently running a fever. Patient reports his chest hurts especially when he coughs.  Past Medical History  Diagnosis Date  . COPD (chronic obstructive pulmonary disease) (HCC)   . Diabetes mellitus without complication (HCC)   . Hypertension   . DVT (deep venous thrombosis) (HCC)   . Subdural hematoma (HCC)   . Paroxysmal atrial fibrillation (HCC)   . Asthma     Patient Active Problem List   Diagnosis Date Noted  . DVT of lower limb, acute (HCC) 06/04/2015  . Encephalopathy 06/04/2015  . Syncope 05/31/2015  . Acute kidney injury (HCC) 05/31/2015  . Healthcare-associated pneumonia 05/31/2015    Past Surgical History  Procedure Laterality Date  . Coronary stent placement    . Ivc filter placement ([facility] hx)      Current Outpatient Rx  Name  Route  Sig  Dispense  Refill  . apixaban (ELIQUIS) 5 MG TABS tablet   Oral   Take 2 tablets (10 mg total) by mouth 2 (two) times daily.   14 tablet   0   . apixaban (ELIQUIS) 5 MG TABS tablet   Oral   Take 1 tablet (5 mg total) by mouth 2 (two) times daily.   60 tablet   3   . carvedilol (COREG) 6.25 MG tablet   Oral   Take 6.25 mg by  mouth 2 (two) times daily.         . cholecalciferol 2000 UNITS TABS   Oral   Take 1 tablet (2,000 Units total) by mouth daily.   30 tablet   0   . diltiazem (CARDIZEM CD) 360 MG 24 hr capsule   Oral   Take 360 mg by mouth daily.         Marland Kitchen gabapentin (NEURONTIN) 300 MG capsule   Oral   Take 300 mg by mouth 3 (three) times daily.         . Melatonin 3 MG TABS   Oral   Take 3 mg by mouth at bedtime as needed (for sleep).         . Multiple Vitamin (MULTIVITAMIN WITH MINERALS) TABS tablet   Oral   Take 1 tablet by mouth daily.         . niacin 100 MG tablet   Oral   Take 300 mg by mouth 3 (three) times daily with meals.         Marland Kitchen omega-3 acid ethyl esters (LOVAZA) 1 G capsule   Oral   Take 2 g by mouth 2 (two) times daily.         . potassium chloride SA (K-DUR,KLOR-CON) 20 MEQ tablet  Oral   Take 20 mEq by mouth daily.         . pravastatin (PRAVACHOL) 40 MG tablet   Oral   Take 40 mg by mouth at bedtime.         . Pyridoxine HCl (VITAMIN B-6) 500 MG tablet   Oral   Take 1,000 mg by mouth daily.         Marland Kitchen. thiamine (VITAMIN B-1) 100 MG tablet   Oral   Take 100 mg by mouth daily.         . vitamin B-12 (CYANOCOBALAMIN) 1000 MCG tablet   Oral   Take 1,000 mcg by mouth daily.         . Vitamin D-Vitamin K (VITAMIN K2-VITAMIN D3) 45-2000 MCG-UNIT CAPS   Oral   Take 1 capsule by mouth daily.           Allergies Review of patient's allergies indicates no known allergies.  Family History  Problem Relation Age of Onset  . Hypertension Other     Social History Social History  Substance Use Topics  . Smoking status: Former Games developermoker  . Smokeless tobacco: None  . Alcohol Use: No    Review of Systems Constitutional: No fever/chills Eyes: No visual changes. ENT: No sore throat. Cardiovascularchest pain. Respiratory: shortness of breath. Gastrointestinal: No abdominal pain.  No nausea, no vomiting.  No diarrhea.  No  constipation. Genitourinary: Negative for dysuria. Musculoskeletal: Negative for back pain. Skin: Negative for rash. Neurological: Negative for headaches, focal weakness or numbness.  10-point ROS otherwise negative.  ____________________________________________   PHYSICAL EXAM:  VITAL SIGNS: ED Triage Vitals  Enc Vitals Group     BP 06/10/15 1907 132/63 mmHg     Pulse Rate 06/10/15 1907 101     Resp 06/10/15 1907 24     Temp 06/10/15 1907 99.6 F (37.6 C)     Temp Source 06/10/15 1907 Oral     SpO2 06/10/15 1901 100 %     Weight 06/10/15 1907 255 lb (115.667 kg)     Height 06/10/15 1907 6\' 1"  (1.854 m)     Head Cir --      Peak Flow --      Pain Score --      Pain Loc --      Pain Edu? --      Excl. in GC? --     Constitutional: Alert and oriented. Well appearing and in no acute distress. Eyes: Conjunctivae are normal. PERRL. EOMI. Head: Atraumatic. Nose: No congestion/rhinnorhea. Mouth/Throat: Mucous membranes are moist.  Oropharynx non-erythematous. Neck: No stridor Cardiovascular: Normal rate, regular rhythm. Grossly normal heart sounds.  Good peripheral circulation. Respiratory: Normal respiratory effort.  No retractions. Lungs rhonchi and rales diffusely Gastrointestinal: Soft and nontender. No distention. No abdominal bruits. No CVA tenderness. Musculoskeletal: No lower extremity tenderness or lateral leg edema.  No joint effusions. Neurologic:  Normal speech and language. No gross focal neurologic deficits are appreciated. No gait instability. Skin:  Skin is warm, dry and intact. No rash noted.   ____________________________________________   LABS (all labs ordered are listed, but only abnormal results are displayed)  Labs Reviewed  COMPREHENSIVE METABOLIC PANEL - Abnormal; Notable for the following:    Creatinine, Ser 1.33 (*)    Calcium 8.6 (*)    Albumin 2.7 (*)    GFR calc non Af Amer 53 (*)    All other components within normal limits  BRAIN  NATRIURETIC PEPTIDE - Abnormal; Notable for  the following:    B Natriuretic Peptide 184.0 (*)    All other components within normal limits  CBC WITH DIFFERENTIAL/PLATELET - Abnormal; Notable for the following:    RBC 3.01 (*)    Hemoglobin 8.5 (*)    HCT 26.7 (*)    MCHC 31.8 (*)    RDW 16.2 (*)    Neutro Abs 7.1 (*)    All other components within normal limits  PROTIME-INR - Abnormal; Notable for the following:    Prothrombin Time 16.7 (*)    All other components within normal limits  TROPONIN I  LACTIC ACID, PLASMA  LACTIC ACID, PLASMA   ____________________________________________  EKG  EKG read and interpreted by me shows no sinus rhythm at a rate of 92 normal axis essentially normal ____________________________________________  RADIOLOGY  CT scan read by radiology shows multi lobar involvement of apparent pneumonia ____________________________________________   PROCEDURES    ____________________________________________   INITIAL IMPRESSION / ASSESSMENT AND PLAN / ED COURSE  Pertinent labs & imaging results that were available during my care of the patient were reviewed by me and considered in my medical decision making (see chart for details).   ____________________________________________   FINAL CLINICAL IMPRESSION(S) / ED DIAGNOSES  Final diagnoses:  Hypoxia  Healthcare-associated pneumonia      Arnaldo Natal, MD 06/10/15 2200  Patient is a [facility] patient. Patient was seen [facility] at [facility] was sent home came here treated here in patient sent home and now returns here his daughter feels that we do not have to clear this with the [facility] to keep him here but I am not sure if the [place] will refuse payment if we keep him here without at least contacting them so I will attempt to contact the [place] and clarify the situation prior to admitting him to the hospital for pneumonia hypoxia in the meantime we'll treat him with antibiotics  Arnaldo Natal, MD 06/10/15 2236

## 2015-06-10 NOTE — H&P (Signed)
[facility] - [place] at [facility]   PATIENT NAME: Thomas [name]    MR#:  [id number]  DATE OF BIRTH:  02/05/1946  DATE OF ADMISSION:  06/10/2015  PRIMARY CARE PHYSICIAN: No PCP Per Patient   REQUESTING/REFERRING PHYSICIAN: Darnelle Catalan, MD  CHIEF COMPLAINT:   Chief Complaint  Patient presents with  . Shortness of Breath    HISTORY OF PRESENT ILLNESS:  Thomas [name]  is a 69 y.o. male who presents with persistent and progressive shortness of breath, as well as cough and wheezing. Patient was recently hospitalized here and treated for healthcare associated pneumonia. He finished his antibiotic course at home, but subsequently developed increased shortness of breath again. Patient states that he was using his nebulizer treatments, up to 4 and 6 times a day most recently, but still would take 20 minutes to get to his kitchen due to his profound dyspnea. So he came to the ED for evaluation. Here he was found to have multifocal opacities on chest x-ray as well as CT scan. Patient does state that he feels like he's had some fever and chills at home. In the ED his labs are largely within normal limits, specifically normal though borderline lactic acid, and normal white blood cell count. Given his imaging findings and symptoms, hospitalists were called for admission for multifocal healthcare associated pneumonia.  PAST MEDICAL HISTORY:   Past Medical History  Diagnosis Date  . COPD (chronic obstructive pulmonary disease) (HCC)   . Diabetes mellitus without complication (HCC)   . Hypertension   . DVT (deep venous thrombosis) (HCC)   . Subdural hematoma (HCC)   . Paroxysmal atrial fibrillation (HCC)   . Asthma     PAST SURGICAL HISTORY:   Past Surgical History  Procedure Laterality Date  . Coronary stent placement    . Ivc filter placement ([facility] hx)      SOCIAL HISTORY:   Social History  Substance Use Topics  . Smoking status: Former Games developer  . Smokeless  tobacco: Not on file  . Alcohol Use: No    FAMILY HISTORY:   Family History  Problem Relation Age of Onset  . Hypertension Other   . Stroke Mother   . Heart failure Father   . Cancer Father     DRUG ALLERGIES:  No Known Allergies  MEDICATIONS AT HOME:   Prior to Admission medications   Medication Sig Start Date End Date Taking? Authorizing Provider  apixaban (ELIQUIS) 5 MG TABS tablet Take 2 tablets (10 mg total) by mouth 2 (two) times daily. 06/04/15 06/11/15  Gale Journey, MD  apixaban (ELIQUIS) 5 MG TABS tablet Take 1 tablet (5 mg total) by mouth 2 (two) times daily. 06/11/15   Gale Journey, MD  carvedilol (COREG) 6.25 MG tablet Take 6.25 mg by mouth 2 (two) times daily.    Historical Provider, MD  cholecalciferol 2000 UNITS TABS Take 1 tablet (2,000 Units total) by mouth daily. 06/04/15   Gale Journey, MD  diltiazem (CARDIZEM CD) 360 MG 24 hr capsule Take 360 mg by mouth daily.    Historical Provider, MD  gabapentin (NEURONTIN) 300 MG capsule Take 300 mg by mouth 3 (three) times daily.    Historical Provider, MD  Melatonin 3 MG TABS Take 3 mg by mouth at bedtime as needed (for sleep).    Historical Provider, MD  Multiple Vitamin (MULTIVITAMIN WITH MINERALS) TABS tablet Take 1 tablet by mouth daily.    Historical Provider, MD  niacin 100  MG tablet Take 300 mg by mouth 3 (three) times daily with meals.    Historical Provider, MD  omega-3 acid ethyl esters (LOVAZA) 1 G capsule Take 2 g by mouth 2 (two) times daily.    Historical Provider, MD  potassium chloride SA (K-DUR,KLOR-CON) 20 MEQ tablet Take 20 mEq by mouth daily.    Historical Provider, MD  pravastatin (PRAVACHOL) 40 MG tablet Take 40 mg by mouth at bedtime.    Historical Provider, MD  Pyridoxine HCl (VITAMIN B-6) 500 MG tablet Take 1,000 mg by mouth daily.    Historical Provider, MD  thiamine (VITAMIN B-1) 100 MG tablet Take 100 mg by mouth daily.    Historical Provider, MD  vitamin B-12 (CYANOCOBALAMIN)  1000 MCG tablet Take 1,000 mcg by mouth daily.    Historical Provider, MD  Vitamin D-Vitamin K (VITAMIN K2-VITAMIN D3) 45-2000 MCG-UNIT CAPS Take 1 capsule by mouth daily.    Historical Provider, MD    REVIEW OF SYSTEMS:  Review of Systems  Constitutional: Positive for fever (subjective) and chills. Negative for weight loss and malaise/fatigue.  HENT: Negative for ear pain, hearing loss and tinnitus.   Eyes: Negative for blurred vision, double vision, pain and redness.  Respiratory: Positive for cough, shortness of breath and wheezing. Negative for hemoptysis.   Cardiovascular: Negative for chest pain, palpitations, orthopnea and leg swelling.  Gastrointestinal: Negative for nausea, vomiting, abdominal pain, diarrhea and constipation.  Genitourinary: Negative for dysuria, frequency and hematuria.  Musculoskeletal: Negative for back pain, joint pain and neck pain.  Skin:       No acne, rash, or lesions  Neurological: Negative for dizziness, tremors, focal weakness and weakness.  Endo/Heme/Allergies: Negative for polydipsia. Does not bruise/bleed easily.  Psychiatric/Behavioral: Negative for depression. The patient is not nervous/anxious and does not have insomnia.      VITAL SIGNS:   Filed Vitals:   06/10/15 1901 06/10/15 1907 06/10/15 2231 06/10/15 2300  BP:  132/63 126/73 128/86  Pulse:  101 94 90  Temp:  99.6 F (37.6 C) 98.2 F (36.8 C)   TempSrc:  Oral Oral   Resp:  Height:   (1.854 m)    Weight:  115.667 kg (255 lb)    SpO2: 100% 91% 96% 96%   Wt Readings from Last 3 Encounters:  06/10/15 115.667 kg (255 lb)  06/04/15 115.939 kg (255 lb 9.6 oz)    PHYSICAL EXAMINATION:  Physical Exam  Vitals reviewed. Constitutional: He is oriented to person, place, and time. He appears well-developed and well-nourished. No distress.  HENT:  Head: Normocephalic and atraumatic.  Mouth/Throat: Oropharynx is clear and moist.  Eyes: Conjunctivae and EOM are normal.  Pupils are equal, round, and reactive to light. No scleral icterus.  Neck: Normal range of motion. Neck supple. No JVD present. No thyromegaly present.  Cardiovascular: Normal rate, regular rhythm and intact distal pulses.  Exam reveals no gallop and no friction rub.   No murmur heard. Respiratory: He is in respiratory distress (mild). He has wheezes ( diffuse bilateral). He has no rales. He exhibits tenderness.  GI: Soft. Bowel sounds are normal. He exhibits no distension. There is no tenderness.  Musculoskeletal: Normal range of motion. He exhibits no edema.  No arthritis, no gout  Lymphadenopathy:    He has no cervical adenopathy.  Neurological: He is alert and oriented to person, place, and time. No cranial nerve deficit.  No dysarthria, no aphasia  Skin: Skin is warm and dry.  No rash noted. No erythema.  Psychiatric: He has a normal mood and affect. His behavior is normal. Judgment and thought content normal.    LABORATORY PANEL:   CBC  Recent Labs Lab 06/10/15 1926  WBC 9.3  HGB 8.5*  HCT 26.7*  PLT 234   ------------------------------------------------------------------------------------------------------------------  Chemistries   Recent Labs Lab 06/10/15 1926  NA 137  K 4.0  CL 101  CO2 27  GLUCOSE 99  BUN 10  CREATININE 1.33*  CALCIUM 8.6*  AST 30  ALT 26  ALKPHOS 64  BILITOT 0.6   ------------------------------------------------------------------------------------------------------------------  Cardiac Enzymes  Recent Labs Lab 06/10/15 1926  TROPONINI <0.03   ------------------------------------------------------------------------------------------------------------------  RADIOLOGY:  Ct Angio Chest Pe W/cm &/or Wo Cm  06/10/2015  CLINICAL DATA:  Recent treatment for pneumonia.  Hypoxia.  Dyspnea. EXAM: CT ANGIOGRAPHY CHEST WITH CONTRAST TECHNIQUE: Multidetector CT imaging of the chest was performed using the standard protocol during bolus  administration of intravenous contrast. Multiplanar CT image reconstructions and MIPs were obtained to evaluate the vascular anatomy. CONTRAST:  75mL OMNIPAQUE IOHEXOL 350 MG/ML SOLN COMPARISON:  Radiographs 06/10/2015 FINDINGS: Cardiovascular: There is good opacification of the pulmonary arteries. There is no pulmonary embolism. The thoracic aorta is normal in caliber and intact. Lungs: There are patchy airspace opacities in the lower lobes bilaterally, and to a lesser extent in the posterior right upper lobe and in the posterior lingula and right middle lobe. This may represent multifocal pneumonia. This is superimposed on severe emphysematous changes. Central airways: Patent Effusions: None Lymphadenopathy: Nonspecific hilar adenopathy, right greater than left, possibly reactive. Esophagus: Unremarkable Upper abdomen: Small hiatal hernia. Musculoskeletal: No significant abnormality Review of the MIP images confirms the above findings. IMPRESSION: 1. Negative for acute pulmonary embolism. 2. Multifocal patchy airspace opacities bilaterally, consistent with pneumonia. Severe emphysematous changes. Electronically Signed   By: Ellery Plunk M.D.   On: 06/10/2015 21:26   Dg Chest Portable 1 View  06/10/2015  CLINICAL DATA:  68 year old male with hypoxia and shortness of breath. Recent pneumonia. EXAM: PORTABLE CHEST 1 VIEW COMPARISON:  06/04/2015 and 05/31/2015 radiographs FINDINGS: Upper limits normal heart size again noted. Pulmonary vascular congestion is present. Bibasilar opacities are stable to minimally increased from the prior study. There is no evidence of pneumothorax or large pleural effusions. IMPRESSION: Stable to minimally increased bibasilar opacities which may represent atelectasis and/ or airspace disease/ pneumonia. Upper limits normal heart size with mild pulmonary vascular congestion. Electronically Signed   By: Harmon Pier M.D.   On: 06/10/2015 20:10    EKG:   Orders placed or  performed during the hospital encounter of 05/31/15  . ED EKG  . ED EKG  . EKG 12-Lead  . EKG 12-Lead    IMPRESSION AND PLAN:  Principal Problem:   Healthcare-associated pneumonia - IV antibiotics given in the ED. We'll continue these on admission. Blood cultures and sputum culture. Lactic acid normal though borderline, will repeat. Gentle limited IV fluids. Active Problems:   COPD exacerbation (chronic obstructive pulmonary disease) (HCC) - IV steroids and DuoNeb's plus antibiotics as above. Patient is not on any home inhalers for this, unclear as to why. May likely need this started while inpatient.    HTN (hypertension) -  continue home antihypertensives    Type 2 diabetes mellitus (HCC) - not on any anti-glycemic medications at home. We'll check a hemoglobin A1c here. Glucose is okay in the ED. Monitor closely. Given that the patient is being started on steroids, may likely need  to start sliding scale insulin at some point. For now will monitor with fingerstick glucose checks.    Paroxysmal a-fib (HCC) - continue home rate controlling medication as well as anticoagulation.     All the records are reviewed and case discussed with ED provider. Management plans discussed with the patient and/or family.  DVT PROPHYLAXIS: SubQ lovenox  ADMISSION STATUS: Inpatient  CODE STATUS: Full  TOTAL TIME TAKING CARE OF THIS PATIENT: 45 minutes.    Sundy Houchins FIELDING 06/10/2015, 11:28 PM  [facility]  Office  [phone]  CC: Primary care physician; No PCP Per Patient

## 2015-06-10 NOTE — ED Notes (Signed)
Pt from home via ems states [facility] and [facility] treated but pt left AMA. Pt was sating at home in the 70s ems placed him on o2 2L and that brought him up to 98%. Pt finished his round of antibiotics for pneumonia

## 2015-06-11 DIAGNOSIS — G4733 Obstructive sleep apnea (adult) (pediatric): Secondary | ICD-10-CM | POA: Diagnosis not present

## 2015-06-11 DIAGNOSIS — I480 Paroxysmal atrial fibrillation: Secondary | ICD-10-CM

## 2015-06-11 DIAGNOSIS — J441 Chronic obstructive pulmonary disease with (acute) exacerbation: Secondary | ICD-10-CM | POA: Diagnosis not present

## 2015-06-11 DIAGNOSIS — J449 Chronic obstructive pulmonary disease, unspecified: Secondary | ICD-10-CM

## 2015-06-11 DIAGNOSIS — J189 Pneumonia, unspecified organism: Secondary | ICD-10-CM | POA: Diagnosis not present

## 2015-06-11 LAB — BASIC METABOLIC PANEL
ANION GAP: 8 (ref 5–15)
BUN: 12 mg/dL (ref 6–20)
CALCIUM: 8.4 mg/dL — AB (ref 8.9–10.3)
CO2: 30 mmol/L (ref 22–32)
CREATININE: 1.39 mg/dL — AB (ref 0.61–1.24)
Chloride: 101 mmol/L (ref 101–111)
GFR calc Af Amer: 59 mL/min — ABNORMAL LOW (ref >60)
GFR, EST NON AFRICAN AMERICAN: 51 mL/min — AB (ref >60)
GLUCOSE: 159 mg/dL — AB (ref 65–99)
Potassium: 4.4 mmol/L (ref 3.5–5.1)
Sodium: 139 mmol/L (ref 135–145)

## 2015-06-11 LAB — C DIFFICILE QUICK SCREEN W PCR REFLEX
C Diff antigen: NEGATIVE
C Diff interpretation: NEGATIVE
C Diff toxin: NEGATIVE

## 2015-06-11 LAB — HEMOGLOBIN A1C: HEMOGLOBIN A1C: 6.1 % — AB (ref 4.0–6.0)

## 2015-06-11 LAB — CBC
HCT: 27.3 % — ABNORMAL LOW (ref 40.0–52.0)
Hemoglobin: 8.7 g/dL — ABNORMAL LOW (ref 13.0–18.0)
MCH: 28 pg (ref 26.0–34.0)
MCHC: 31.7 g/dL — AB (ref 32.0–36.0)
MCV: 88.4 fL (ref 80.0–100.0)
PLATELETS: 222 10*3/uL (ref 150–440)
RBC: 3.09 MIL/uL — ABNORMAL LOW (ref 4.40–5.90)
RDW: 16 % — AB (ref 11.5–14.5)
WBC: 7.4 10*3/uL (ref 3.8–10.6)

## 2015-06-11 LAB — GLUCOSE, CAPILLARY
GLUCOSE-CAPILLARY: 100 mg/dL — AB (ref 65–99)
GLUCOSE-CAPILLARY: 163 mg/dL — AB (ref 65–99)
GLUCOSE-CAPILLARY: 167 mg/dL — AB (ref 65–99)
GLUCOSE-CAPILLARY: 149 mg/dL — AB (ref 65–99)
GLUCOSE-CAPILLARY: 178 mg/dL — AB (ref 65–99)

## 2015-06-11 LAB — LACTIC ACID, PLASMA: LACTIC ACID, VENOUS: 1.2 mmol/L (ref 0.5–2.0)

## 2015-06-11 MED ORDER — CARVEDILOL 6.25 MG PO TABS
6.2500 mg | ORAL_TABLET | Freq: Two times a day (BID) | ORAL | Status: DC
  Administered 2015-06-11 – 2015-06-16 (×11): 6.25 mg via ORAL
  Filled 2015-06-11 (×12): qty 1

## 2015-06-11 MED ORDER — SODIUM CHLORIDE 0.9 % IV SOLN
INTRAVENOUS | Status: DC
  Administered 2015-06-11: 02:00:00 via INTRAVENOUS

## 2015-06-11 MED ORDER — ONDANSETRON HCL 4 MG/2ML IJ SOLN: 4.0000 mg | Freq: Four times a day (QID) | INTRAMUSCULAR | Status: DC | PRN

## 2015-06-11 MED ORDER — APIXABAN 5 MG PO TABS
10.0000 mg | ORAL_TABLET | Freq: Two times a day (BID) | ORAL | Status: AC
  Administered 2015-06-11: 10 mg via ORAL
  Filled 2015-06-11: qty 2

## 2015-06-11 MED ORDER — METHYLPREDNISOLONE SODIUM SUCC 125 MG IJ SOLR
80.0000 mg | Freq: Two times a day (BID) | INTRAMUSCULAR | Status: DC
  Administered 2015-06-11 – 2015-06-16 (×12): 80 mg via INTRAVENOUS
  Filled 2015-06-11 (×13): qty 2

## 2015-06-11 MED ORDER — FUROSEMIDE 10 MG/ML IJ SOLN
40.0000 mg | Freq: Once | INTRAMUSCULAR | Status: AC
  Administered 2015-06-11: 40 mg via INTRAVENOUS
  Filled 2015-06-11: qty 4

## 2015-06-11 MED ORDER — IPRATROPIUM-ALBUTEROL 0.5-2.5 (3) MG/3ML IN SOLN: 3.0000 mL | RESPIRATORY_TRACT | Status: DC | PRN

## 2015-06-11 MED ORDER — SODIUM CHLORIDE 0.9 % IJ SOLN
3.0000 mL | Freq: Two times a day (BID) | INTRAMUSCULAR | Status: DC
  Administered 2015-06-12 – 2015-06-14 (×5): 3 mL via INTRAVENOUS

## 2015-06-11 MED ORDER — APIXABAN 5 MG PO TABS
5.0000 mg | ORAL_TABLET | Freq: Two times a day (BID) | ORAL | Status: DC
  Administered 2015-06-11 – 2015-06-16 (×11): 5 mg via ORAL
  Filled 2015-06-11 (×11): qty 1

## 2015-06-11 MED ORDER — BENZONATATE 100 MG PO CAPS: 200.0000 mg | ORAL_CAPSULE | Freq: Three times a day (TID) | ORAL | Status: DC | PRN

## 2015-06-11 MED ORDER — DILTIAZEM HCL ER COATED BEADS 180 MG PO CP24
360.0000 mg | ORAL_CAPSULE | Freq: Every day | ORAL | Status: DC
  Administered 2015-06-11 – 2015-06-16 (×5): 360 mg via ORAL
  Filled 2015-06-11 (×6): qty 2

## 2015-06-11 MED ORDER — PRAVASTATIN SODIUM 20 MG PO TABS
40.0000 mg | ORAL_TABLET | Freq: Every day | ORAL | Status: DC
  Administered 2015-06-11 – 2015-06-15 (×6): 40 mg via ORAL
  Filled 2015-06-11 (×6): qty 2

## 2015-06-11 MED ORDER — ACETAMINOPHEN 325 MG PO TABS
650.0000 mg | ORAL_TABLET | Freq: Four times a day (QID) | ORAL | Status: DC | PRN
  Administered 2015-06-12 – 2015-06-14 (×4): 650 mg via ORAL
  Filled 2015-06-11 (×4): qty 2

## 2015-06-11 MED ORDER — GABAPENTIN 300 MG PO CAPS
300.0000 mg | ORAL_CAPSULE | Freq: Three times a day (TID) | ORAL | Status: DC
  Administered 2015-06-11 – 2015-06-16 (×15): 300 mg via ORAL
  Filled 2015-06-11 (×15): qty 1

## 2015-06-11 MED ORDER — IPRATROPIUM-ALBUTEROL 0.5-2.5 (3) MG/3ML IN SOLN
3.0000 mL | Freq: Four times a day (QID) | RESPIRATORY_TRACT | Status: AC
  Administered 2015-06-11 – 2015-06-13 (×7): 3 mL via RESPIRATORY_TRACT
  Filled 2015-06-11 (×7): qty 3

## 2015-06-11 MED ORDER — ACETAMINOPHEN 650 MG RE SUPP: 650.0000 mg | Freq: Four times a day (QID) | RECTAL | Status: DC | PRN

## 2015-06-11 MED ORDER — PIPERACILLIN-TAZOBACTAM 4.5 G IVPB
4.5000 g | Freq: Three times a day (TID) | INTRAVENOUS | Status: DC
  Administered 2015-06-11 – 2015-06-15 (×12): 4.5 g via INTRAVENOUS
  Filled 2015-06-11 (×15): qty 100

## 2015-06-11 MED ORDER — VANCOMYCIN HCL 10 G IV SOLR
1250.0000 mg | Freq: Two times a day (BID) | INTRAVENOUS | Status: DC
  Administered 2015-06-11: 1250 mg via INTRAVENOUS
  Filled 2015-06-11 (×2): qty 1250

## 2015-06-11 MED ORDER — VANCOMYCIN HCL IN DEXTROSE 1-5 GM/200ML-% IV SOLN
1000.0000 mg | Freq: Two times a day (BID) | INTRAVENOUS | Status: DC
Start: 2015-06-11 — End: 2015-06-13
  Administered 2015-06-11 – 2015-06-12 (×3): 1000 mg via INTRAVENOUS
  Filled 2015-06-11 (×4): qty 200

## 2015-06-11 MED ORDER — ONDANSETRON HCL 4 MG PO TABS
4.0000 mg | ORAL_TABLET | Freq: Four times a day (QID) | ORAL | Status: DC | PRN
  Administered 2015-06-13: 4 mg via ORAL
  Filled 2015-06-11: qty 1

## 2015-06-11 NOTE — Progress Notes (Signed)
PT Cancellation Note  Patient Details Name: Thomas [name] MRN: [id number] DOB: 18-Sep-1945   Cancelled Treatment:    Reason Eval/Treat Not Completed: Other (comment). Chart reviewed, RN consulted: RN reports that [facility] Pt remains on 5L O2 and has been up frequently with nursing within the room, desaturating to high 80's with activity. Will defer PT eval at this time. RN spoke to referring physician who is agreeable.    Buccola,Allan C 06/11/2015, 2:43 PM 2:45 PM  Rosamaria LintsAllan C Buccola, PT, DPT [place] License # [id number]
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
## 2015-06-11 NOTE — Progress Notes (Signed)
ANTIBIOTIC CONSULT NOTE - Follow up  Pharmacy Consult for vancomycin and Zosyn dosing Indication: HCAP  No Known Allergies  Patient Measurements: Height: 6\' 1"  (185.4 cm) Weight: 255 lb (115.667 kg) IBW/kg (Calculated) : 79.9 Adjusted Body Weight: 94 kg  Vital Signs: Temp: 99.1 F (37.3 C) (11/29 0151) Temp Source: Oral (11/29 0151) BP: 125/74 mmHg (11/29 0151) Pulse Rate: 88 (11/29 0151) Intake/Output from previous day: 11/28 0701 - 11/29 0700 In: -  Out: 500 [Urine:500] Intake/Output from this shift: Total I/O In: 120 [P.O.:120] Out: 0   Labs:  Recent Labs  06/10/15 1926 06/11/15 0634  WBC 9.3 7.4  HGB 8.5* 8.7*  PLT 234 222  CREATININE 1.33* 1.39*   Estimated Creatinine Clearance: 67.8 mL/min (by C-G formula based on Cr of 1.39). No results for input(s): VANCOTROUGH, VANCOPEAK, VANCORANDOM, GENTTROUGH, GENTPEAK, GENTRANDOM, TOBRATROUGH, TOBRAPEAK, TOBRARND, AMIKACINPEAK, AMIKACINTROU, AMIKACIN in the last 72 hours.   Microbiology: Recent Results (from the past 720 hour(s))  Culture, blood (routine x 2)     Status: None   Collection Time: 05/31/15  9:20 PM  Result Value Ref Range Status   Specimen Description BLOOD LEFT ASSIST CONTROL  Final   Special Requests BOTTLES DRAWN AEROBIC AND ANAEROBIC 4CC  Final   Culture  Setup Time   Final    GRAM POSITIVE RODS AEROBIC BOTTLE ONLY CRITICAL RESULT CALLED TO, READ BACK BY AND VERIFIED WITH: MARCEL TURNER AT 2353 ON 06/01/15 [facility] CONFIRMED BY [facility]    Culture   Final    GRAM POSITIVE RODS AEROBIC BOTTLE ONLY Results consistent with contamination.    Report Status 06/05/2015 FINAL  Final  Culture, blood (routine x 2)     Status: None   Collection Time: 05/31/15  9:20 PM  Result Value Ref Range Status   Specimen Description BLOOD RIGHT ASSIST CONTROL  Final   Special Requests BOTTLES DRAWN AEROBIC AND ANAEROBIC 4CC  Final   Culture NO GROWTH 5 DAYS  Final   Report Status 06/05/2015 FINAL  Final    Medical  History: Past Medical History  Diagnosis Date  . COPD (chronic obstructive pulmonary disease) (HCC)   . Diabetes mellitus without complication (HCC)   . Hypertension   . DVT (deep venous thrombosis) (HCC)   . Subdural hematoma (HCC)   . Paroxysmal atrial fibrillation (HCC)   . Asthma     Medications:   Assessment: 69 yo male readmitted for HCAP  Ke 0.052, half life 13 h, Vd 66 L  Goal of Therapy:  Vancomycin trough level 15-20 mcg/ml  Plan:  SCr trended up a little, current dosing of Vancomycin 1250 mg IV q 12 hours ordered with stacked dosing may result in high trough, will decrease dose to vancomycin 1000 mg IV q12h for expected trough of ~17-18. Level before 4th dose - 11/30 at 1730 with SCr. Will need to continue to monitor renal function as pt at risk for accumulation. If renal function continues to worsen, will need to extend out dosing interval.   Zosyn 4.5 grams q 8 hours ordered due to Pseudomonas risk of recent hospitalization with abx received.  Crist FatWang, Sem Mccaughey L 06/11/2015,9:58 AM

## 2015-06-11 NOTE — Consult Note (Signed)
PULMONARY / CRITICAL CARE MEDICINE   Name: Thomas [name] MRN: [id number] DOB: [birth year]    ADMISSION DATE:  06/10/2015 CONSULTATION DATE:  06/11/15  REFERRING MD :  Dr. Delfino Lovett   CHIEF COMPLAINT:     Shortness of breath and weakness   HISTORY OF PRESENT ILLNESS    69 y.o. male who presents with persistent and progressive shortness of breath, as well as cough and wheezing. Patient was recently hospitalized here and treated for healthcare associated pneumonia. He finished his antibiotic course at home, but subsequently developed increased shortness of breath again. Patient states that he was using his nebulizer treatments, up to 4 and 6 times a day most recently, but still would take 20 minutes to get to his kitchen due to his profound dyspnea. So he came to the ED for evaluation. Here he was found to have multifocal opacities on chest x-ray as well as CT scan. Patient does state that he feels like he's had some fever and chills at home. CT chest in the ED showed multifocal PNA, PCCM consulted for recurrent PNA and COPD. Review of chart shows that he was recently admitted to the hospital, [facility], 4.5 days for an episode of healthcare associated pneumonia treated with vancomycin and Zosyn, discharged on Levaquin. Also, review of chart shows he was in [facility] for about one month from October 18 to mid November, he had an extensive fall followed by respiratory distress requiring mechanical ventilation,, Q by sepsis and another episode of healthcare associated pneumonia. Daughters and wife are in the room. His wife is currently smoking both inside and outside of the house, states over the last couple days she's been smoking more outside. Patient states that he follows with pulmonology at the [facility], he has only been on albuterol inhalers and albuterol nebulizers which she's been using frequently. Family states that they burn wood to cook and heat the home, and they have been doing this for  years.      SIGNIFICANT EVENTS   04/2015-05/2015 - about 1 stay at [facility], ICU, on MV, for sepsis, accidental fall, head trauma 11/18-11/23 - recent admission to [facility] for HCAP, vanc/zosyn, dc'ed on Levaquin   PAST MEDICAL HISTORY    :  Past Medical History  Diagnosis Date  . COPD (chronic obstructive pulmonary disease) (HCC)   . Diabetes mellitus without complication (HCC)   . Hypertension   . DVT (deep venous thrombosis) (HCC)   . Subdural hematoma (HCC)   . Paroxysmal atrial fibrillation (HCC)   . Asthma    Past Surgical History  Procedure Laterality Date  . Coronary stent placement    . Ivc filter placement ([facility] hx)     Prior to Admission medications   Medication Sig Start Date End Date Taking? Authorizing Provider  apixaban (ELIQUIS) 5 MG TABS tablet Take 2 tablets (10 mg total) by mouth 2 (two) times daily. 06/04/15 06/11/15  Gale Journey, MD  apixaban (ELIQUIS) 5 MG TABS tablet Take 1 tablet (5 mg total) by mouth 2 (two) times daily. 06/11/15   Gale Journey, MD  carvedilol (COREG) 6.25 MG tablet Take 6.25 mg by mouth 2 (two) times daily.    Historical Provider, MD  cholecalciferol 2000 UNITS TABS Take 1 tablet (2,000 Units total) by mouth daily. 06/04/15   Gale Journey, MD  diltiazem (CARDIZEM CD) 360 MG 24 hr capsule Take 360 mg by mouth daily.    Historical Provider, MD  gabapentin (NEURONTIN) 300 MG  capsule Take 300 mg by mouth 3 (three) times daily.    Historical Provider, MD  Melatonin 3 MG TABS Take 3 mg by mouth at bedtime as needed (for sleep).    Historical Provider, MD  Multiple Vitamin (MULTIVITAMIN WITH MINERALS) TABS tablet Take 1 tablet by mouth daily.    Historical Provider, MD  niacin 100 MG tablet Take 300 mg by mouth 3 (three) times daily with meals.    Historical Provider, MD  omega-3 acid ethyl esters (LOVAZA) 1 G capsule Take 2 g by mouth 2 (two) times daily.    Historical Provider, MD  potassium chloride SA (K-DUR,KLOR-CON) 20 MEQ  tablet Take 20 mEq by mouth daily.    Historical Provider, MD  pravastatin (PRAVACHOL) 40 MG tablet Take 40 mg by mouth at bedtime.    Historical Provider, MD  Pyridoxine HCl (VITAMIN B-6) 500 MG tablet Take 1,000 mg by mouth daily.    Historical Provider, MD  thiamine (VITAMIN B-1) 100 MG tablet Take 100 mg by mouth daily.    Historical Provider, MD  vitamin B-12 (CYANOCOBALAMIN) 1000 MCG tablet Take 1,000 mcg by mouth daily.    Historical Provider, MD  Vitamin D-Vitamin K (VITAMIN K2-VITAMIN D3) 45-2000 MCG-UNIT CAPS Take 1 capsule by mouth daily.    Historical Provider, MD   No Known Allergies   FAMILY HISTORY   Family History  Problem Relation Age of Onset  . Hypertension Other   . Stroke Mother   . Heart failure Father   . Cancer Father       SOCIAL HISTORY    reports that he has quit smoking. He does not have any smokeless tobacco history on file. He reports that he does not drink alcohol or use illicit drugs.  Review of Systems  Constitutional: Positive for malaise/fatigue. Negative for fever, chills and weight loss.  HENT: Positive for congestion and sore throat. Negative for ear pain, hearing loss and tinnitus.   Eyes: Negative for blurred vision and double vision.  Respiratory: Positive for cough, sputum production, shortness of breath and wheezing.   Cardiovascular: Negative for chest pain.  Gastrointestinal: Negative for heartburn and nausea.  Musculoskeletal: Negative for myalgias.  Skin: Negative for itching and rash.  Neurological: Positive for weakness. Negative for dizziness.  Endo/Heme/Allergies: Bruises/bleeds easily.      VITAL SIGNS    Temp:  [97.4 F (36.3 C)-99.6 F (37.6 C)] 97.4 F (36.3 C) (11/29 1146) Pulse Rate:  [75-101] 75 (11/29 1146) Resp:  [18-24] 18 (11/29 1146) BP: (111-151)/(61-86) 111/61 mmHg (11/29 1146) SpO2:  [91 %-100 %] 98 % (11/29 1146) Weight:  [255 lb (115.667 kg)] 255 lb (115.667 kg) (11/28 1907) HEMODYNAMICS:    VENTILATOR SETTINGS:   INTAKE / OUTPUT:  Intake/Output Summary (Last 24 hours) at 06/11/15 1235 Last data filed at 06/11/15 1610  Gross per 24 hour  Intake    120 ml  Output    800 ml  Net   -680 ml       PHYSICAL EXAM   Physical Exam  Constitutional: He appears well-developed and well-nourished.  HENT:  Head: Normocephalic and atraumatic.  Eyes: Pupils are equal, round, and reactive to light. Right eye exhibits no discharge. Left eye exhibits no discharge.  Neck: Normal range of motion. Neck supple.  Cardiovascular: Normal rate, regular rhythm, normal heart sounds and intact distal pulses.  Exam reveals no friction rub.   No murmur heard. Pulmonary/Chest: No respiratory distress. He has no wheezes. He has no rales.  Mild decreased breath sounds at the bases, no wheezes noted. Coarse upper airway sounds Mild left greater than right decreased breath sounds  Nursing note and vitals reviewed.      LABS   LABS:  CBC  Recent Labs Lab 06/05/15 0456 06/10/15 1926 06/11/15 0634  WBC 10.8* 9.3 7.4  HGB 7.8* 8.5* 8.7*  HCT 24.2* 26.7* 27.3*  PLT 293 234 222   Coag's  Recent Labs Lab 06/10/15 1926  INR 1.34   BMET  Recent Labs Lab 06/05/15 0456 06/10/15 1926 06/11/15 0634  NA 141 137 139  K 3.7 4.0 4.4  CL 108 101 101  CO2 BUN CREATININE 1.19 1.33* 1.39*  GLUCOSE 113* 99 159*   Electrolytes  Recent Labs Lab 06/05/15 0456 06/10/15 1926 06/11/15 0634  CALCIUM 8.6* 8.6* 8.4*   Sepsis Markers  Recent Labs Lab 06/10/15 1926 06/11/15 0634  LATICACIDVEN 2.0 1.2   ABG No results for input(s): PHART, PCO2ART, PO2ART in the last 168 hours. Liver Enzymes  Recent Labs Lab 06/05/15 0456 06/10/15 1926  AST 21 30  ALT 29 26  ALKPHOS 56 64  BILITOT 0.4 0.6  ALBUMIN 2.6* 2.7*   Cardiac Enzymes  Recent Labs Lab 06/10/15 1926  TROPONINI <0.03   Glucose  Recent Labs Lab 06/11/15 0146 06/11/15 0743 06/11/15 1142   GLUCAP 100* 163* 167*     Recent Results (from the past 240 hour(s))  C difficile quick scan w PCR reflex     Status: None   Collection Time: 06/11/15  9:04 AM  Result Value Ref Range Status   C Diff antigen NEGATIVE NEGATIVE Final   C Diff toxin NEGATIVE NEGATIVE Final   C Diff interpretation Negative for C. difficile  Corrected    Comment: CORRECTED ON 11/29 AT 1135: PREVIOUSLY REPORTED AS VALID     Current facility-administered medications:  .  acetaminophen (TYLENOL) tablet 650 mg, 650 mg, Oral, Q6H PRN **OR** acetaminophen (TYLENOL) suppository 650 mg, 650 mg, Rectal, Q6H PRN, Oralia Manis, MD .  apixaban Everlene Balls) tablet 5 mg, 5 mg, Oral, BID, Oralia Manis, MD, 5 mg at 06/11/15 0910 .  benzonatate (TESSALON) capsule 200 mg, 200 mg, Oral, TID PRN, Oralia Manis, MD .  carvedilol (COREG) tablet 6.25 mg, 6.25 mg, Oral, BID, Oralia Manis, MD, 6.25 mg at 06/11/15 0910 .  diltiazem (CARDIZEM CD) 24 hr capsule 360 mg, 360 mg, Oral, Daily, Oralia Manis, MD, 360 mg at 06/11/15 0910 .  ipratropium-albuterol (DUONEB) 0.5-2.5 (3) MG/3ML nebulizer solution 3 mL, 3 mL, Nebulization, Q4H PRN, Oralia Manis, MD .  methylPREDNISolone sodium succinate (SOLU-MEDROL) 125 mg/2 mL injection 80 mg, 80 mg, Intravenous, Q12H, Oralia Manis, MD, 80 mg at 06/11/15 0148 .  ondansetron (ZOFRAN) tablet 4 mg, 4 mg, Oral, Q6H PRN **OR** ondansetron (ZOFRAN) injection 4 mg, 4 mg, Intravenous, Q6H PRN, Oralia Manis, MD .  piperacillin-tazobactam (ZOSYN) IVPB 4.5 g, 4.5 g, Intravenous, 3 times per day, Oralia Manis, MD, 4.5 g at 06/11/15 0911 .  pravastatin (PRAVACHOL) tablet 40 mg, 40 mg, Oral, QHS, Oralia Manis, MD, 40 mg at 06/11/15 0149 .  sodium chloride 0.9 % injection 3 mL, 3 mL, Intravenous, Q12H, Oralia Manis, MD, 3 mL at 06/11/15 0149 .  vancomycin (VANCOCIN) IVPB 1000 mg/200 mL premix, 1,000 mg, Intravenous, Q12H, Oralia Manis, MD  IMAGING    Ct Angio Chest Pe W/cm &/or Wo Cm  06/10/2015  CLINICAL  DATA:  Recent treatment for pneumonia.  Hypoxia.  Dyspnea. EXAM: CT ANGIOGRAPHY CHEST WITH CONTRAST TECHNIQUE: Multidetector CT imaging of the chest was performed using the standard protocol during bolus administration of intravenous contrast. Multiplanar CT image reconstructions and MIPs were obtained to evaluate the vascular anatomy. CONTRAST:  75mL OMNIPAQUE IOHEXOL 350 MG/ML SOLN COMPARISON:  Radiographs 06/10/2015 FINDINGS: Cardiovascular: There is good opacification of the pulmonary arteries. There is no pulmonary embolism. The thoracic aorta is normal in caliber and intact. Lungs: There are patchy airspace opacities in the lower lobes bilaterally, and to a lesser extent in the posterior right upper lobe and in the posterior lingula and right middle lobe. This may represent multifocal pneumonia. This is superimposed on severe emphysematous changes. Central airways: Patent Effusions: None Lymphadenopathy: Nonspecific hilar adenopathy, right greater than left, possibly reactive. Esophagus: Unremarkable Upper abdomen: Small hiatal hernia. Musculoskeletal: No significant abnormality Review of the MIP images confirms the above findings. IMPRESSION: 1. Negative for acute pulmonary embolism. 2. Multifocal patchy airspace opacities bilaterally, consistent with pneumonia. Severe emphysematous changes. Electronically Signed   By: Ellery Plunkaniel R Mitchell M.D.   On: 06/10/2015 21:26   Dg Chest Portable 1 View  06/10/2015  CLINICAL DATA:  69 year old male with hypoxia and shortness of breath. Recent pneumonia. EXAM: PORTABLE CHEST 1 VIEW COMPARISON:  06/04/2015 and 05/31/2015 radiographs FINDINGS: Upper limits normal heart size again noted. Pulmonary vascular congestion is present. Bibasilar opacities are stable to minimally increased from the prior study. There is no evidence of pneumothorax or large pleural effusions. IMPRESSION: Stable to minimally increased bibasilar opacities which may represent atelectasis and/ or  airspace disease/ pneumonia. Upper limits normal heart size with mild pulmonary vascular congestion. Electronically Signed   By: Harmon PierJeffrey  Hu M.D.   On: 06/10/2015 20:10      Indwelling Urinary Catheter continued, requirement due to   Reason to continue Indwelling Urinary Catheter for strict Intake/Output monitoring for hemodynamic instability   Central Line continued, requirement due to   Reason to continue [employer] Monitoring of central venous pressure or other hemodynamic parameters   Ventilator continued, requirement due to, resp failure    Ventilator Sedation RASS 0 to -2   Cultures: BCx2  UC  Sputum  Antibiotics: Vancomycin 11/28>> Zosyn 11/28>> Lines:   ASSESSMENT/PLAN  69 year old male male past medical history of COPD, DVT, hypertension, subdural hematoma, atrial fibrillation, recent admission for healthcare associated pneumonia now readmitted with multifocal/multilobar pneumonia and hypoxia.   Acute respiratory failure-hypoxia Healthcare associated pneumonia COPD exacerbation ? OSA -Continue with current antibiotics of vancomycin and Zosyn -Sputum culture -BiPAP at night minimal 4 hours (patient is supposed to have an outpatient sleep study, this was ordered by the [facility], but he has been avoiding it, there is high suspicion for obstructive sleep apnea) -Maintain O2 saturations are 88% -Physical therapy -Review of the CT shows chronic emphysematous changes along with lower lobe scarring, there is more infiltrate/opacity in the right lower lobe compared to the left, concerning for possible silent aspiration -Recommend barium swallow study for further evaluation of possible aspiration -Continue with nebulizers and steroids -I believe that continuous noxious substances in his home, wife smoking indoors, burning for a few and heating and cooking is making recovery from COPD exacerbations and pneumonia more difficult.  Atrial fibrillation -Paraoxysmal -Currently  on anticoagulation  DVT - on anticoagulation  Pulmonary consult time devoted to patient care services described in this note is 45 minutes.   Stephanie AcreVishal Zea Kostka, MD [facility] Pager [phone]- [phone] (please enter 7-digits) On  Call Pager - [phone] (please enter 7-digits)     06/11/2015, 12:35 PM  Note: This note was prepared with Dragon dictation along with smaller phrase technology. Any transcriptional errors that result from this process are unintentional.

## 2015-06-11 NOTE — Progress Notes (Signed)
Patient is a readmit from home with wife. [facility] last admission. Clinical Social Worker (CSW) contacted [facility] and notified them that patient is back in the hospital.   Jetta LoutBailey Morgan, LCSWA [phone]

## 2015-06-11 NOTE — Plan of Care (Signed)
Problem: Education: Goal: Knowledge of [facility] Education information/materials will improve Outcome: Progressing Patient need more teaching.

## 2015-06-11 NOTE — Progress Notes (Signed)
[facility] is [facility] to be [facility] sometime. He states that he wants to cancel physical therapy. Dr. Sherryll BurgerShah notified, PT eval order canceled.

## 2015-06-11 NOTE — Care Management Note (Addendum)
Case Management Note  Patient Details  Name: Thomas [name] MRN: [id number] Date of Birth: [birth year]  Subjective/Objective:                  Met with patient and his granddaughter Thomas [name] to discuss discharge planning. Patient states he [facility]because [facility]"; "they were working on it". Thomas [name] states that patient has not been walking although he has a walker with a seat on it "due to weakness". Patient states he signed the refusal to transfer to the [place] when he came in through the ED yesterday. He plans to remain at [facility]. I explained that it would be easier to transfer him to the [facility] and have his needs met since that is his PCP.  He states he is "going home when he leaves here but does not want to transfer to the [place]". On last admission [facility]- he is still open to their care. Thomas [name] states that patient has been sleeping on the couch and needs a hospital bed, wheelchair, and Oxygen prior to discharge. See previous admission from [facility] note below: notes faxed to patient [place] PCP.     Action/Plan: I have faxed this note, history and physical, last discharge summary and labs to [facility]. I spoke with [place] provider Ardis Rowan [phone] ext. [phone] and he states that patient was never issued O2 through [place]. They apparently use [facility] [place] [phone] fax [phone]. They will not provide wheelchair or hospital bed to this patient- only O2 and the order must come from [place] [facility]. [name]'s fax is [phone]. I will fax O2 sats to [facility] when RN puts them in. I confirmed with Danne Baxter that O2 sat documentation was received by him but he needs the complete assessment with exertion-  Estill Bamberg RN aware.   Expected Discharge Date:                  Expected Discharge Plan:     In-House Referral:     Discharge planning Services  CM Consult  Post Acute Care Choice:   Durable Medical Equipment, Home Health Choice offered to:  Patient  DME Arranged:    DME Agency:     HH Arranged:    [place] Agency:  [place]  Status of Service:  In process, will continue to follow  Medicare Important Message Given:    Date Medicare IM Given:    Medicare IM give by:    Date Additional Medicare IM Given:    Additional Medicare Important Message give by:     If discussed at [place] of Stay Meetings, dates discussed:    Additional Comments: Previous admission RNCM note: Telephone call to Dr. Seward Meth office. [phone]). Spoke with Nurse Verdis Frederickson. All notes and requested have been entered into there system. Dr. Orvil Feil will review and sign. Will fax face sheet, discharge summary, face-to-face and home health orders to [phone]. Ordered nursing, physical therapy, occupational therapy, Education officer, museum, and aide for home services.  Received telephone call from Nurse Verdis Frederickson. States that [facility] Nurse indicated that these services will need to be arranged from this hospital and [facility] will pay. Will send referral to [place]. Discharge to home today per Dr. Manuella Ghazi. Shelbie Ammons RN MSN CCM Care Management [phone]   Marshell Garfinkel, RN 06/11/2015, 9:56 AM

## 2015-06-11 NOTE — Care Management (Signed)
[facility]. [facility] forms faxed with patient's request.to transfer.  RN can call VA at  [phone] ext [phone] (administrator after 1600).

## 2015-06-11 NOTE — Progress Notes (Signed)
[facility] - [place] at [facility]   PATIENT NAME: Thomas [name]    MR#:  [id number]  DATE OF BIRTH:  08-05-1945  SUBJECTIVE:  CHIEF COMPLAINT:   Chief Complaint  Patient presents with  . Shortness of Breath  some better  REVIEW OF SYSTEMS:  Review of Systems  Constitutional: Negative for fever, weight loss, malaise/fatigue and diaphoresis.  HENT: Negative for ear discharge, ear pain, hearing loss, nosebleeds, sore throat and tinnitus.   Eyes: Negative for blurred vision and pain.  Respiratory: Positive for cough and shortness of breath. Negative for hemoptysis and wheezing.   Cardiovascular: Negative for chest pain, palpitations, orthopnea and leg swelling.  Gastrointestinal: Negative for heartburn, nausea, vomiting, abdominal pain, diarrhea, constipation and blood in stool.  Genitourinary: Negative for dysuria, urgency and frequency.  Musculoskeletal: Negative for myalgias and back pain.  Skin: Negative for itching and rash.  Neurological: Negative for dizziness, tingling, tremors, focal weakness, seizures, weakness and headaches.  Psychiatric/Behavioral: Negative for depression. The patient is not nervous/anxious.     DRUG ALLERGIES:  No Known Allergies VITALS:  Blood pressure 111/61, pulse 75, temperature 97.4 F (36.3 C), temperature source Oral, resp. rate 18, height  (1.854 m), weight 115.667 kg (255 lb), SpO2 90 %. PHYSICAL EXAMINATION:  Physical Exam  Constitutional: He is oriented to person, place, and time and well-developed, well-nourished, and in no distress.  HENT:  Head: Normocephalic and atraumatic.  Eyes: Conjunctivae and EOM are normal. Pupils are equal, round, and reactive to light.  Neck: Normal range of motion. Neck supple. No tracheal deviation present. No thyromegaly present.  Cardiovascular: Normal rate, regular rhythm and normal heart sounds.   Pulmonary/Chest: Effort normal and breath sounds normal. No respiratory  distress. He has no wheezes. He exhibits no tenderness.  Abdominal: Soft. Bowel sounds are normal. He exhibits no distension. There is no tenderness.  Musculoskeletal: Normal range of motion.  Neurological: He is alert and oriented to person, place, and time. No cranial nerve deficit.  Skin: Skin is warm and dry. No rash noted.  Psychiatric: Mood and affect normal.   LABORATORY PANEL:   CBC  Recent Labs Lab 06/11/15 0634  WBC 7.4  HGB 8.7*  HCT 27.3*  PLT 222   ------------------------------------------------------------------------------------------------------------------ Chemistries   Recent Labs Lab 06/10/15 1926 06/11/15 0634  NA 137 139  K 4.0 4.4  CL 101 101  CO2 27 30  GLUCOSE 99 159*  BUN 10 12  CREATININE 1.33* 1.39*  CALCIUM 8.6* 8.4*  AST 30  --   ALT 26  --   ALKPHOS 64  --   BILITOT 0.6  --    RADIOLOGY:  Ct Angio Chest Pe W/cm &/or Wo Cm  06/10/2015  CLINICAL DATA:  Recent treatment for pneumonia.  Hypoxia.  Dyspnea. EXAM: CT ANGIOGRAPHY CHEST WITH CONTRAST TECHNIQUE: Multidetector CT imaging of the chest was performed using the standard protocol during bolus administration of intravenous contrast. Multiplanar CT image reconstructions and MIPs were obtained to evaluate the vascular anatomy. CONTRAST:  75mL OMNIPAQUE IOHEXOL 350 MG/ML SOLN COMPARISON:  Radiographs 06/10/2015 FINDINGS: Cardiovascular: There is good opacification of the pulmonary arteries. There is no pulmonary embolism. The thoracic aorta is normal in caliber and intact. Lungs: There are patchy airspace opacities in the lower lobes bilaterally, and to a lesser extent in the posterior right upper lobe and in the posterior lingula and right middle lobe. This may represent multifocal pneumonia. This is superimposed on severe emphysematous changes. Central airways:  Patent Effusions: None Lymphadenopathy: Nonspecific hilar adenopathy, right greater than left, possibly reactive. Esophagus:  Unremarkable Upper abdomen: Small hiatal hernia. Musculoskeletal: No significant abnormality Review of the MIP images confirms the above findings. IMPRESSION: 1. Negative for acute pulmonary embolism. 2. Multifocal patchy airspace opacities bilaterally, consistent with pneumonia. Severe emphysematous changes. Electronically Signed   By: Ellery Plunkaniel R Mitchell M.D.   On: 06/10/2015 21:26   Dg Chest Portable 1 View  06/10/2015  CLINICAL DATA:  69 year old male with hypoxia and shortness of breath. Recent pneumonia. EXAM: PORTABLE CHEST 1 VIEW COMPARISON:  06/04/2015 and 05/31/2015 radiographs FINDINGS: Upper limits normal heart size again noted. Pulmonary vascular congestion is present. Bibasilar opacities are stable to minimally increased from the prior study. There is no evidence of pneumothorax or large pleural effusions. IMPRESSION: Stable to minimally increased bibasilar opacities which may represent atelectasis and/ or airspace disease/ pneumonia. Upper limits normal heart size with mild pulmonary vascular congestion. Electronically Signed   By: Harmon PierJeffrey  Hu M.D.   On: 06/10/2015 20:10   ASSESSMENT AND PLAN:  69 y.o. male who presented with persistent and progressive shortness of breath, as well as cough and wheezing admitted for  * Healthcare-associated pneumonia -Continue Vanco and zosyn. Pending Blood cultures and sputum culture. continue IV fluids.  * COPD exacerbation (chronic obstructive pulmonary disease) (HCC) - continue IV steroids and DuoNeb's plus antibiotics as above. Appreciate Pulmo input. Continue with current antibiotics of vancomycin and Zosyn - BiPAP at night minimal 4 hours  -Maintain O2 saturations are 88% per PCCM -Review of the CT shows chronic emphysematous changes along with lower lobe scarring, may be some component of aspiration - may need barium swallow study for further evaluation of possible aspiration - overall poor psycho-social situations making him high risk for  recurrent admissions.  * HTN (hypertension) - continue coreg, cardizem,   * Type 2 diabetes mellitus (HCC) - not on any anti-glycemic medications at home. Hemoglobin A1c 6.1. monitor  * Paroxysmal a-fib (HCC) - continue home rate controlling medication as well as anticoagulation. On Eliquis and cardizem, coreg.     All the records are reviewed and case discussed with Care Management/Social Worker. Management plans discussed with the patient, family and they are in agreement.  CODE STATUS: Full Code  TOTAL TIME TAKING CARE OF THIS PATIENT: 35 minutes.   More than 50% of the time was spent in counseling/coordination of care: YES  POSSIBLE D/C IN 1-2 DAYS, DEPENDING ON CLINICAL CONDITION. [facility].   [facility], Adryana Mogensen M.D on 06/11/2015 at 2:53 PM  Between 7am to 6pm - Pager - [phone]  After 6pm go to [email or web address] - password [email or web address] [facility]  [facility]  Office  [phone]  CC: Primary care physician; No PCP Per Patient  Note: This dictation was prepared with Dragon dictation along with smaller phrase technology. Any transcriptional errors that result from this process are unintentional.

## 2015-06-11 NOTE — Progress Notes (Signed)
SATURATION QUALIFICATIONS: (This note is used to comply with regulatory documentation for home oxygen)  Patient Saturations on Room Air at Rest = 85%   

## 2015-06-11 NOTE — Discharge Summary (Signed)
[facility] Physicians - [place] at [facility]   PATIENT NAME: Thomas [name]    MR#:  [id number]  DATE OF BIRTH:  31-Oct-1945  DATE OF ADMISSION:  06/10/2015 ADMITTING PHYSICIAN: Oralia Manis, MD  DATE OF DISCHARGE: 06/11/2015   PRIMARY CARE PHYSICIAN: [facility]   ADMISSION DIAGNOSIS:  Hypoxia [R09.02] Healthcare-associated pneumonia [J18.9]  DISCHARGE DIAGNOSIS:  Principal Problem:   Healthcare-associated pneumonia Active Problems:   HTN (hypertension)   Type 2 diabetes mellitus (HCC)   Paroxysmal a-fib (HCC)   COPD exacerbation (HCC)  SECONDARY DIAGNOSIS:   Past Medical History  Diagnosis Date  . COPD (chronic obstructive pulmonary disease) (HCC)   . Diabetes mellitus without complication (HCC)   . Hypertension   . DVT (deep venous thrombosis) (HCC)   . Subdural hematoma (HCC)   . Paroxysmal atrial fibrillation (HCC)   . Asthma     HOSPITAL COURSE:  69 y.o. male who presents with persistent and progressive shortness of breath, as well as cough and wheezing. Patient was recently hospitalized here and treated for healthcare associated pneumonia. He finished his antibiotic course at home, but subsequently developed increased shortness of breath again. Patient states that he was using his nebulizer treatments, up to 4 and 6 times a day most recently, but still would take 20 minutes to get to his kitchen due to his profound dyspnea. So he came down to ED and admitted for Acute hypoxic resp failure due to HCAP. Please see Dr Clarisa Kindred dictated H & P for further details.  He was evaluated by Pulmo and recommended -BiPAP at night minimal 4 hours (patient is supposed to have an outpatient sleep study, this was ordered by the [place], but he has been avoiding it, there is high suspicion for obstructive sleep apnea) -Maintain O2 saturations are 88% -Physical therapy -Review of the CT shows chronic emphysematous changes along with lower lobe scarring, there is more  infiltrate/opacity in the right lower lobe compared to the left, concerning for possible silent aspiration -Recommend barium swallow study for further evaluation of possible aspiration -Continue with nebulizers and steroids -I believe that continuous noxious substances in his home, wife smoking indoors, burning for a few and heating and cooking is making recovery from COPD exacerbations and pneumonia more difficult.  Patient and family members are agreeable with transfer to [facility] if beds available tonight.  DISCHARGE CONDITIONS:   stable  CONSULTS OBTAINED:  Treatment Team:  Shane Crutch, MD  DRUG ALLERGIES:  No Known Allergies  DISCHARGE MEDICATIONS:   Current Discharge Medication List    CONTINUE these medications which have NOT CHANGED   Details  !! apixaban (ELIQUIS) 5 MG TABS tablet Take 2 tablets (10 mg total) by mouth 2 (two) times daily. Qty: 14 tablet, Refills: 0    !! apixaban (ELIQUIS) 5 MG TABS tablet Take 1 tablet (5 mg total) by mouth 2 (two) times daily. Qty: 60 tablet, Refills: 3    carvedilol (COREG) 6.25 MG tablet Take 6.25 mg by mouth 2 (two) times daily.    cholecalciferol 2000 UNITS TABS Take 1 tablet (2,000 Units total) by mouth daily. Qty: 30 tablet, Refills: 0    diltiazem (CARDIZEM CD) 360 MG 24 hr capsule Take 360 mg by mouth daily.    gabapentin (NEURONTIN) 300 MG capsule Take 300 mg by mouth 3 (three) times daily.    Melatonin 3 MG TABS Take 3 mg by mouth at bedtime as needed (for sleep).    Multiple Vitamin (MULTIVITAMIN WITH MINERALS) TABS  tablet Take 1 tablet by mouth daily.    niacin 100 MG tablet Take 300 mg by mouth 3 (three) times daily with meals.    omega-3 acid ethyl esters (LOVAZA) 1 G capsule Take 2 g by mouth 2 (two) times daily.    potassium chloride SA (K-DUR,KLOR-CON) 20 MEQ tablet Take 20 mEq by mouth daily.    pravastatin (PRAVACHOL) 40 MG tablet Take 40 mg by mouth at bedtime.    Pyridoxine HCl (VITAMIN B-6) 500 MG  tablet Take 1,000 mg by mouth daily.    thiamine (VITAMIN B-1) 100 MG tablet Take 100 mg by mouth daily.    vitamin B-12 (CYANOCOBALAMIN) 1000 MCG tablet Take 1,000 mcg by mouth daily.    Vitamin D-Vitamin K (VITAMIN K2-VITAMIN D3) 45-2000 MCG-UNIT CAPS Take 1 capsule by mouth daily.     !! - Potential duplicate medications found. Please discuss with provider.       DISCHARGE INSTRUCTIONS:    DIET:  Regular diet  DISCHARGE CONDITION:  Good  ACTIVITY:  Activity as tolerated  OXYGEN:  Home Oxygen: No.   Oxygen Delivery: room air  DISCHARGE LOCATION:  [facility]   If you experience worsening of your admission symptoms, develop shortness of breath, life threatening emergency, suicidal or homicidal thoughts you must seek medical attention immediately by calling 911 or calling your MD immediately  if symptoms less severe.  You Must read complete instructions/literature along with all the possible adverse reactions/side effects for all the Medicines you take and that have been prescribed to you. Take any new Medicines after you have completely understood and accpet all the possible adverse reactions/side effects.   Please note  You were cared for by a hospitalist during your hospital stay. If you have any questions about your discharge medications or the care you received while you were in the hospital after you are discharged, you can call the unit and asked to speak with the hospitalist on call if the hospitalist that took care of you is not available. Once you are discharged, your primary care physician will handle any further medical issues. Please note that NO REFILLS for any discharge medications will be authorized once you are discharged, as it is imperative that you return to your primary care physician (or establish a relationship with a primary care physician if you do not have one) for your aftercare needs so that they can reassess your need for medications and monitor  your lab values.    On the day of Discharge:  VITAL SIGNS:  Blood pressure 111/61, pulse 65, temperature 97.4 F (36.3 C), temperature source Oral, resp. rate 18, height 6\' 1"  (1.854 m), weight 115.667 kg (255 lb), SpO2 92 %.  PHYSICAL EXAMINATION:  GENERAL:  69 y.o.-year-old patient lying in the bed with no acute distress.  EYES: Pupils equal, round, reactive to light and accommodation. No scleral icterus. Extraocular muscles intact.  HEENT: Head atraumatic, normocephalic. Oropharynx and nasopharynx clear.  NECK:  Supple, no jugular venous distention. No thyroid enlargement, no tenderness.  LUNGS: Normal breath sounds bilaterally, no wheezing, rales,rhonchi or crepitation. No use of accessory muscles of respiration.  CARDIOVASCULAR: S1, S2 normal. No murmurs, rubs, or gallops.  ABDOMEN: Soft, non-tender, non-distended. Bowel sounds present. No organomegaly or mass.  EXTREMITIES: No pedal edema, cyanosis, or clubbing.  NEUROLOGIC: Cranial nerves II through XII are intact. Muscle strength 5/5 in all extremities. Sensation intact. Gait not checked.  PSYCHIATRIC: The patient is alert and oriented x 3.  SKIN:  No obvious rash, lesion, or ulcer.  DATA REVIEW:   CBC  Recent Labs Lab 06/11/15 0634  WBC 7.4  HGB 8.7*  HCT 27.3*  PLT 222    Chemistries   Recent Labs Lab 06/10/15 1926 06/11/15 0634  NA 137 139  K 4.0 4.4  CL 101 101  CO2 27 30  GLUCOSE 99 159*  BUN 10 12  CREATININE 1.33* 1.39*  CALCIUM 8.6* 8.4*  AST 30  --   ALT 26  --   ALKPHOS 64  --   BILITOT 0.6  --     Cardiac Enzymes  Recent Labs Lab 06/10/15 1926  TROPONINI <0.03    Microbiology Results  Results for orders placed or performed during the hospital encounter of 06/10/15  C difficile quick scan w PCR reflex     Status: None   Collection Time: 06/11/15  9:04 AM  Result Value Ref Range Status   C Diff antigen NEGATIVE NEGATIVE Final   C Diff toxin NEGATIVE NEGATIVE Final   C Diff  interpretation Negative for C. difficile  Corrected    Comment: CORRECTED ON 11/29 AT 1135: PREVIOUSLY REPORTED AS VALID    RADIOLOGY:  Ct Angio Chest Pe W/cm &/or Wo Cm  06/10/2015  CLINICAL DATA:  Recent treatment for pneumonia.  Hypoxia.  Dyspnea. EXAM: CT ANGIOGRAPHY CHEST WITH CONTRAST TECHNIQUE: Multidetector CT imaging of the chest was performed using the standard protocol during bolus administration of intravenous contrast. Multiplanar CT image reconstructions and MIPs were obtained to evaluate the vascular anatomy. CONTRAST:  75mL OMNIPAQUE IOHEXOL 350 MG/ML SOLN COMPARISON:  Radiographs 06/10/2015 FINDINGS: Cardiovascular: There is good opacification of the pulmonary arteries. There is no pulmonary embolism. The thoracic aorta is normal in caliber and intact. Lungs: There are patchy airspace opacities in the lower lobes bilaterally, and to a lesser extent in the posterior right upper lobe and in the posterior lingula and right middle lobe. This may represent multifocal pneumonia. This is superimposed on severe emphysematous changes. Central airways: Patent Effusions: None Lymphadenopathy: Nonspecific hilar adenopathy, right greater than left, possibly reactive. Esophagus: Unremarkable Upper abdomen: Small hiatal hernia. Musculoskeletal: No significant abnormality Review of the MIP images confirms the above findings. IMPRESSION: 1. Negative for acute pulmonary embolism. 2. Multifocal patchy airspace opacities bilaterally, consistent with pneumonia. Severe emphysematous changes. Electronically Signed   By: Ellery Plunkaniel R Mitchell M.D.   On: 06/10/2015 21:26   Dg Chest Portable 1 View  06/10/2015  CLINICAL DATA:  69 year old male with hypoxia and shortness of breath. Recent pneumonia. EXAM: PORTABLE CHEST 1 VIEW COMPARISON:  06/04/2015 and 05/31/2015 radiographs FINDINGS: Upper limits normal heart size again noted. Pulmonary vascular congestion is present. Bibasilar opacities are stable to minimally  increased from the prior study. There is no evidence of pneumothorax or large pleural effusions. IMPRESSION: Stable to minimally increased bibasilar opacities which may represent atelectasis and/ or airspace disease/ pneumonia. Upper limits normal heart size with mild pulmonary vascular congestion. Electronically Signed   By: Harmon PierJeffrey  Hu M.D.   On: 06/10/2015 20:10     Management plans discussed with the patient, family and they are in agreement.  CODE STATUS:     Code Status Orders        Start     Ordered   06/11/15 0132  Full code   Continuous     06/11/15 0131      TOTAL TIME TAKING CARE OF THIS PATIENT: 55 minutes.    [facility], Neesha Langton M.D on 06/11/2015 at 4:48  PM  Between 7am to 6pm - Pager - [phone]  After 6pm go to [email or web address] - password [email or web address] [facility]  [place] [facility]  Office  [phone]  CC: Primary care physician; [facility]  Note: This dictation was prepared with Dragon dictation along with smaller phrase technology. Any transcriptional errors that result from this process are unintentional.

## 2015-06-11 NOTE — Progress Notes (Signed)
SATURATION QUALIFICATIONS: (This note is used to comply with regulatory documentation for home oxygen)  Patient Saturations on Room Air at Rest = 85 % AT REST ON RA  Patient Saturations on Room Air while Ambulating = 85%  Patient Saturations on 6  Liters of oxygen while Ambulating =90% PT SAT ON 6LITERS AT REST 95% Please briefly explain why patient needs home oxygen:Oxygen Therapy  COPD

## 2015-06-11 NOTE — Progress Notes (Signed)
SATURATION QUALIFICATIONS: (This note is used to comply with regulatory documentation for home oxygen)  Patient Saturations on Room Air at Rest = 85%   Please briefly explain why patient needs home oxygen: COPD 

## 2015-06-11 NOTE — Progress Notes (Signed)
OT Cancellation Note  Patient Details Name: Thomas CholJames M Dickman MRN: [id number] DOB: [birth year]   Cancelled Treatment:    Reason Eval/Treat Not Completed: Other (comment). Patient busy with nursing. Will re- attempt time permitting.  Ocie CornfieldHuff, Saylor Sheckler M 06/11/2015, 10:49 AM

## 2015-06-11 NOTE — Progress Notes (Signed)
ANTIBIOTIC CONSULT NOTE - INITIAL  Pharmacy Consult for vancomycin and Zosyn dosing Indication: HCAP  No Known Allergies  Patient Measurements: Height: 6\' 1"  (185.4 cm) Weight: 255 lb (115.667 kg) IBW/kg (Calculated) : 79.9 Adjusted Body Weight: 95kg  Vital Signs: Temp: 99.1 F (37.3 C) (11/29 0151) Temp Source: Oral (11/29 0151) BP: 125/74 mmHg (11/29 0151) Pulse Rate: 88 (11/29 0151) Intake/Output from previous day: 11/28 0701 - 11/29 0700 In: -  Out: 300 [Urine:300] Intake/Output from this shift: Total I/O In: -  Out: 300 [Urine:300]  Labs:  Recent Labs  06/10/15 1926  WBC 9.3  HGB 8.5*  PLT 234  CREATININE 1.33*   Estimated Creatinine Clearance: 70.8 mL/min (by C-G formula based on Cr of 1.33). No results for input(s): VANCOTROUGH, VANCOPEAK, VANCORANDOM, GENTTROUGH, GENTPEAK, GENTRANDOM, TOBRATROUGH, TOBRAPEAK, TOBRARND, AMIKACINPEAK, AMIKACINTROU, AMIKACIN in the last 72 hours.   Microbiology: Recent Results (from the past 720 hour(s))  Culture, blood (routine x 2)     Status: None   Collection Time: 05/31/15  9:20 PM  Result Value Ref Range Status   Specimen Description BLOOD LEFT ASSIST CONTROL  Final   Special Requests BOTTLES DRAWN AEROBIC AND ANAEROBIC 4CC  Final   Culture  Setup Time   Final    GRAM POSITIVE RODS AEROBIC BOTTLE ONLY CRITICAL RESULT CALLED TO, READ BACK BY AND VERIFIED WITH: MARCEL TURNER AT 2353 ON 06/01/15 [facility] CONFIRMED BY [facility]    Culture   Final    GRAM POSITIVE RODS AEROBIC BOTTLE ONLY Results consistent with contamination.    Report Status 06/05/2015 FINAL  Final  Culture, blood (routine x 2)     Status: None   Collection Time: 05/31/15  9:20 PM  Result Value Ref Range Status   Specimen Description BLOOD RIGHT ASSIST CONTROL  Final   Special Requests BOTTLES DRAWN AEROBIC AND ANAEROBIC 4CC  Final   Culture NO GROWTH 5 DAYS  Final   Report Status 06/05/2015 FINAL  Final    Medical History: Past Medical History   Diagnosis Date  . COPD (chronic obstructive pulmonary disease) (HCC)   . Diabetes mellitus without complication (HCC)   . Hypertension   . DVT (deep venous thrombosis) (HCC)   . Subdural hematoma (HCC)   . Paroxysmal atrial fibrillation (HCC)   . Asthma     Medications:   Assessment: Sputum cx pending CXR: bibasilar opacities  Goal of Therapy:  Vancomycin trough level 15-20 mcg/ml  Plan:  TBW 115.7kg  IW 79.9kg  DW 95kg  Vd 63L kei 0.063 hr-1   T1/2 11 hours Vancomycin 1250 mg q 12 hours ordered with stacked dosing. Level before 5th dose.  Zosyn 4.5 grams q 8 hours ordered due to Pseudomonas risk of recent hospitalization with abx received.  Jahaira Earnhart S 06/11/2015,3:28 AM

## 2015-06-12 DIAGNOSIS — J189 Pneumonia, unspecified organism: Secondary | ICD-10-CM | POA: Diagnosis not present

## 2015-06-12 DIAGNOSIS — J441 Chronic obstructive pulmonary disease with (acute) exacerbation: Secondary | ICD-10-CM | POA: Diagnosis not present

## 2015-06-12 LAB — VANCOMYCIN, TROUGH: Vancomycin Tr: 45 ug/mL (ref 10–20)

## 2015-06-12 LAB — GLUCOSE, CAPILLARY
GLUCOSE-CAPILLARY: 148 mg/dL — AB (ref 65–99)
GLUCOSE-CAPILLARY: 152 mg/dL — AB (ref 65–99)
Glucose-Capillary: 185 mg/dL — ABNORMAL HIGH (ref 65–99)
Glucose-Capillary: 143 mg/dL — ABNORMAL HIGH (ref 65–99)

## 2015-06-12 LAB — CREATININE, SERUM
CREATININE: 1.78 mg/dL — AB (ref 0.61–1.24)
GFR calc Af Amer: 43 mL/min — ABNORMAL LOW (ref >60)
GFR calc non Af Amer: 37 mL/min — ABNORMAL LOW (ref >60)

## 2015-06-12 NOTE — Progress Notes (Signed)
ANTIBIOTIC CONSULT NOTE - INITIAL  Pharmacy Consult for vancomycin and Zosyn dosing Indication: HCAP  No Known Allergies  Patient Measurements: Height: 6\' 1"  (185.4 cm) Weight: 255 lb (115.667 kg) IBW/kg (Calculated) : 79.9 Adjusted Body Weight: 95kg  Vital Signs: Temp: 98 F (36.7 C) (11/30 1458) Temp Source: Oral (11/30 1458) BP: 112/52 mmHg (11/30 1458) Pulse Rate: 68 (11/30 1458) Intake/Output from previous day: 11/29 0701 - 11/30 0700 In: 900 [P.O.:600; IV Piggyback:300] Out: 300 [Urine:300] Intake/Output from this shift: Total I/O In: 840 [P.O.:840] Out: -   Labs:  Recent Labs  06/10/15 1926 06/11/15 0634 06/12/15 1745  WBC 9.3 7.4  --   HGB 8.5* 8.7*  --   PLT 234 222  --   CREATININE 1.33* 1.39* 1.78*   Estimated Creatinine Clearance: 52.9 mL/min (by C-G formula based on Cr of 1.78).  Recent Labs  06/12/15 1745  VANCOTROUGH 45*     Microbiology: Recent Results (from the past 720 hour(s))  Culture, blood (routine x 2)     Status: None   Collection Time: 05/31/15  9:20 PM  Result Value Ref Range Status   Specimen Description BLOOD LEFT ASSIST CONTROL  Final   Special Requests BOTTLES DRAWN AEROBIC AND ANAEROBIC 4CC  Final   Culture  Setup Time   Final    GRAM POSITIVE RODS AEROBIC BOTTLE ONLY CRITICAL RESULT CALLED TO, READ BACK BY AND VERIFIED WITH: MARCEL TURNER AT 2353 ON 06/01/15 RWW CONFIRMED BY [facility]    Culture   Final    GRAM POSITIVE RODS AEROBIC BOTTLE ONLY Results consistent with contamination.    Report Status 06/05/2015 FINAL  Final  Culture, blood (routine x 2)     Status: None   Collection Time: 05/31/15  9:20 PM  Result Value Ref Range Status   Specimen Description BLOOD RIGHT ASSIST CONTROL  Final   Special Requests BOTTLES DRAWN AEROBIC AND ANAEROBIC 4CC  Final   Culture NO GROWTH 5 DAYS  Final   Report Status 06/05/2015 FINAL  Final  C difficile quick scan w PCR reflex     Status: None   Collection Time: 06/11/15  9:04  AM  Result Value Ref Range Status   C Diff antigen NEGATIVE NEGATIVE Final   C Diff toxin NEGATIVE NEGATIVE Final   C Diff interpretation Negative for C. difficile  Corrected    Comment: CORRECTED ON 11/29 AT 1135: PREVIOUSLY REPORTED AS VALID    Medical History: Past Medical History  Diagnosis Date  . COPD (chronic obstructive pulmonary disease) (HCC)   . Diabetes mellitus without complication (HCC)   . Hypertension   . DVT (deep venous thrombosis) (HCC)   . Subdural hematoma (HCC)   . Paroxysmal atrial fibrillation (HCC)   . Asthma     Medications:   Assessment: Sputum cx pending CXR: bibasilar opacities  Goal of Therapy:  Vancomycin trough level 15-20 mcg/ml  Plan:  TBW 115.7kg  IW 79.9kg  DW 95kg  Vd 63L kei 0.063 hr-1   T1/2 11 hours Vancomycin 1250 mg q 12 hours ordered with stacked dosing. Level before 5th dose.  11/30 1745 VT =45 Level drawn while dose was infusing. Will redraw level prior to the next dose. 1201 @ 0530  Thomas [name] Alayia Meggison 06/12/2015,6:33 PM

## 2015-06-12 NOTE — Clinical Social Work Note (Signed)
Clinical Social Work Assessment  Patient Details  Name: Thomas [name] MRN: [id number] Date of Birth: March 05, 1946  Date of referral:  06/12/15               Reason for consult:  Facility Placement, Other (Comment Required) (COPD Gold )                Permission sought to share information with:    Permission granted to share information::  No  Name::        Agency::     Relationship::     Contact Information:     Housing/Transportation Living arrangements for the past 2 months:  Single Family Home Source of Information:  Patient Patient Interpreter Needed:  None Criminal Activity/Legal Involvement Pertinent to Current Situation/Hospitalization:  No - Comment as needed Significant Relationships:  Adult Children, Spouse Lives with:  Adult Children, Spouse Do you feel safe going back to the place where you live?  Yes Need for family participation in patient care:  Yes (Comment)  Care giving concerns:  Patient lives in [place] with his wife Jeannene Patella and adult daughter Benjamine Mola.    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 CSW contacted patient's APS worker Lala Lund and made her aware that patient is her at [facility]. Per chart PT recommended SNF on last admission and patient refused. MD is currently pursuing transfer to the [facility]. CSW met with patient alone at bedside. Patient was alert and oriented and sitting up in the chair. CSW introduced self and explained role of CSW department. Patient reported that he lives with his wife Jeannene Patella and daughter Benjamine Mola in [place]. Patient reported that he is going home from this hospital or the [place]. Patient adamantly refused SNF again on this admission. Patient reported that "if he is going to die it will be in his own bed." Patient reported that all he needs is oxygen. Per patient his wife and daughter can provide 24/7 care. Patient reported that he receives all his medications and medical  care through the [place]. RN Case Manager aware of above. CSW will continue to follow and assist as needed.    Employment status:  Disabled (Comment on whether or not currently receiving Disability), Retired Forensic scientist:  [facility] Benef[facility] PT Recommendations:  Not assessed at this time Information / Referral to community resources:  Other (Comment Required) (RN Case Manager to arrange home oxygen )  Patient/Family's Response to care: Patient refused SNF and reported that he is going home.   Patient/Family's Understanding of and Emotional Response to Diagnosis, Current Treatment, and Prognosis: Patient was pleasant throughout assessment.   Emotional Assessment Appearance:  Appears stated age Attitude/Demeanor/Rapport:    Affect (typically observed):  Accepting, Adaptable, Pleasant Orientation:  Oriented to Self, Oriented to Place, Oriented to  Time, Oriented to Situation Alcohol / Substance use:  Not Applicable Psych involvement (Current and /or in the community):  No (Comment)  Discharge Needs  Concerns to be addressed:  Denies Needs/Concerns at this time Readmission within the last 30 days:  No Current discharge risk:  Dependent with Mobility Barriers to Discharge:  Continued Medical Work up   Loralyn Freshwater, LCSW 06/12/2015, 2:20 PM

## 2015-06-12 NOTE — Progress Notes (Signed)
Spoke with Dr.Shah regarding patients blood pressure of 98/52, heart rate of 72. Order received to hold the Cardizem and coreg this am.

## 2015-06-12 NOTE — Progress Notes (Addendum)
ANTIBIOTIC CONSULT NOTE - Follow up  Pharmacy Consult for vancomycin and Zosyn dosing Indication: HCAP  No Known Allergies  Patient Measurements: Height: 6\' 1"  (185.4 cm) Weight: 255 lb (115.667 kg) IBW/kg (Calculated) : 79.9 Adjusted Body Weight: 94 kg  Vital Signs: Temp: 98 F (36.7 C) (11/30 1458) Temp Source: Oral (11/30 1458) BP: 112/52 mmHg (11/30 1458) Pulse Rate: 68 (11/30 1458) Intake/Output from previous day: 11/29 0701 - 11/30 0700 In: 900 [P.O.:600; IV Piggyback:300] Out: 300 [Urine:300] Intake/Output from this shift: Total I/O In: 840 [P.O.:840] Out: -   Labs:  Recent Labs  06/10/15 1926 06/11/15 0634 06/12/15 1745  WBC 9.3 7.4  --   HGB 8.5* 8.7*  --   PLT 234 222  --   CREATININE 1.33* 1.39* 1.78*   Estimated Creatinine Clearance: 52.9 mL/min (by C-G formula based on Cr of 1.78).  Recent Labs  06/12/15 1745  VANCOTROUGH 45*     Microbiology: Recent Results (from the past 720 hour(s))  Culture, blood (routine x 2)     Status: None   Collection Time: 05/31/15  9:20 PM  Result Value Ref Range Status   Specimen Description BLOOD LEFT ASSIST CONTROL  Final   Special Requests BOTTLES DRAWN AEROBIC AND ANAEROBIC 4CC  Final   Culture  Setup Time   Final    GRAM POSITIVE RODS AEROBIC BOTTLE ONLY CRITICAL RESULT CALLED TO, READ BACK BY AND VERIFIED WITH: MARCEL TURNER AT 2353 ON 06/01/15 RWW CONFIRMED BY [facility]    Culture   Final    GRAM POSITIVE RODS AEROBIC BOTTLE ONLY Results consistent with contamination.    Report Status 06/05/2015 FINAL  Final  Culture, blood (routine x 2)     Status: None   Collection Time: 05/31/15  9:20 PM  Result Value Ref Range Status   Specimen Description BLOOD RIGHT ASSIST CONTROL  Final   Special Requests BOTTLES DRAWN AEROBIC AND ANAEROBIC 4CC  Final   Culture NO GROWTH 5 DAYS  Final   Report Status 06/05/2015 FINAL  Final  C difficile quick scan w PCR reflex     Status: None   Collection Time: 06/11/15   9:04 AM  Result Value Ref Range Status   C Diff antigen NEGATIVE NEGATIVE Final   C Diff toxin NEGATIVE NEGATIVE Final   C Diff interpretation Negative for C. difficile  Corrected    Comment: CORRECTED ON 11/29 AT 1135: PREVIOUSLY REPORTED AS VALID    Medical History: Past Medical History  Diagnosis Date  . COPD (chronic obstructive pulmonary disease) (HCC)   . Diabetes mellitus without complication (HCC)   . Hypertension   . DVT (deep venous thrombosis) (HCC)   . Subdural hematoma (HCC)   . Paroxysmal atrial fibrillation (HCC)   . Asthma     Medications:   Assessment: 69 yo male readmitted for HCAP  Ke 0.052, half life 13 h, Vd 66 L  Goal of Therapy:  Vancomycin trough level 15-20 mcg/ml  Plan:  SCr trended up a little, current dosing of Vancomycin 1250 mg IV q 12 hours ordered with stacked dosing may result in high trough, will decrease dose to vancomycin 1000 mg IV q12h for expected trough of ~17-18. Level before 4th dose - 11/30 at 1730 with SCr. Will need to continue to monitor renal function as pt at risk for accumulation. If renal function continues to worsen, will need to extend out dosing interval.   Zosyn 4.5 grams q 8 hours ordered due to Pseudomonas risk  of recent hospitalization with abx received.  11/30:  Vanc trough @ 17:30 = 45 mcg/mL ,   Lab drew vanc sample from same line as vanc was infusing.   Will reorder Vanc trough before next dose on 12/1 @ 5:30.   Teairra Millar D 06/12/2015,6:54 PM

## 2015-06-12 NOTE — Progress Notes (Addendum)
Called received from Lab regarding pt's vancomycin trough level of 45. Prim Doctor paged- waiting for call back. Pharmacy notified of vancomycin trough level of 45.

## 2015-06-12 NOTE — Care Management (Signed)
[facility]  Stated that as of 1007 am [facility] per Lorie from bed control.  Katherene PontoKiandra stated that th [facility] needed updated documented sats.  I provided her with RA at rest, RA with ambulation, Ambulation with O2, and at rest of O2. She stated that the patient needed to ambulate for 6 minutes.  Documentation needs to be provided of the progression of increase of liter flow.  If patient needs to pause during ambulation documentation is need of how long it takes for the patient to recover.  RN and AD of department notified.

## 2015-06-12 NOTE — Progress Notes (Signed)
Paged Prime Doc again- waiting for call back to notify of van trough level of 45.

## 2015-06-12 NOTE — Progress Notes (Signed)
[facility] [place] [facility]   PATIENT NAME: Thomas BarterJames [name]    MR#:  [id number]  DATE OF BIRTH:  04-13-1946  SUBJECTIVE:  CHIEF COMPLAINT:   Chief Complaint  Patient presents with  . Shortness of Breath  sitting in chair, eating break-fast. No new issues  REVIEW OF SYSTEMS:  Review of Systems  Constitutional: Negative for fever, weight loss, malaise/fatigue and diaphoresis.  HENT: Negative for ear discharge, ear pain, hearing loss, nosebleeds, sore throat and tinnitus.   Eyes: Negative for blurred vision and pain.  Respiratory: Positive for cough and shortness of breath. Negative for hemoptysis and wheezing.   Cardiovascular: Negative for chest pain, palpitations, orthopnea and leg swelling.  Gastrointestinal: Negative for heartburn, nausea, vomiting, abdominal pain, diarrhea, constipation and blood in stool.  Genitourinary: Negative for dysuria, urgency and frequency.  Musculoskeletal: Negative for myalgias and back pain.  Skin: Negative for itching and rash.  Neurological: Negative for dizziness, tingling, tremors, focal weakness, seizures, weakness and headaches.  Psychiatric/Behavioral: Negative for depression. The patient is not nervous/anxious.     DRUG ALLERGIES:  No Known Allergies VITALS:  Blood pressure 114/60, pulse 67, temperature 97.4 F (36.3 C), temperature source Oral, resp. rate 19, height 6\' 1"  (1.854 m), weight 115.667 kg (255 lb), SpO2 96 %. PHYSICAL EXAMINATION:  Physical Exam  Constitutional: He is oriented to person, place, and time and well-developed, well-nourished, and in no distress.  HENT:  Head: Normocephalic and atraumatic.  Eyes: Conjunctivae and EOM are normal. Pupils are equal, round, and reactive to light.  Neck: Normal range of motion. Neck supple. No tracheal deviation present. No thyromegaly present.  Cardiovascular: Normal rate, regular rhythm and normal heart sounds.   Pulmonary/Chest: Effort normal and  breath sounds normal. No respiratory distress. He has no wheezes. He exhibits no tenderness.  Abdominal: Soft. Bowel sounds are normal. He exhibits no distension. There is no tenderness.  Musculoskeletal: Normal range of motion.  Neurological: He is alert and oriented to person, place, and time. No cranial nerve deficit.  Skin: Skin is warm and dry. No rash noted.  Psychiatric: Mood and affect normal.   LABORATORY PANEL:   CBC  Recent Labs Lab 06/11/15 0634  WBC 7.4  HGB 8.7*  HCT 27.3*  PLT 222   ------------------------------------------------------------------------------------------------------------------ Chemistries   Recent Labs Lab 06/10/15 1926 06/11/15 0634  NA 137 139  K 4.0 4.4  CL 101 101  CO2 27 30  GLUCOSE 99 159*  BUN 10 12  CREATININE 1.33* 1.39*  CALCIUM 8.6* 8.4*  AST 30  --   ALT 26  --   ALKPHOS 64  --   BILITOT 0.6  --    RADIOLOGY:  No results found. ASSESSMENT AND PLAN:  69 y.o. male who presented with persistent and progressive shortness of breath, as well as cough and wheezing admitted for  * Healthcare-associated pneumonia -Continue Vanco and zosyn. Pending Blood cultures and sputum culture. Can stop IV fluids today as he's eating-drinking.  * COPD exacerbation (chronic obstructive pulmonary disease) (HCC) - continue IV steroids and DuoNeb's plus antibiotics as above. Appreciate Pulmo input. Continue with current antibiotics of vancomycin and Zosyn - BiPAP at night minimal 4 hours  -Maintain O2 saturations are 88% per PCCM -Review of the CT shows chronic emphysematous changes along with lower lobe scarring, may be some component of aspiration - may need barium swallow study for further evaluation of possible aspiration - overall poor psycho-social situations making him high risk for  recurrent admissions.  * HTN (hypertension) - continue coreg, cardizem,   * Type 2 diabetes mellitus (HCC) - not on any anti-glycemic medications at  home. Hemoglobin A1c 6.1. monitor  * Paroxysmal a-fib (HCC) - continue home rate controlling medication as well as anticoagulation. On Eliquis and cardizem, coreg.   [facility]   All the records are reviewed and case discussed with Care Management/Social Worker. Management plans discussed with the patient, family and they are in agreement.  CODE STATUS: Full Code  TOTAL TIME TAKING CARE OF THIS PATIENT: 35 minutes.   More than 50% of the time was spent in counseling/coordination of care: YES  POSSIBLE D/C IN 1-2 DAYS, DEPENDING ON CLINICAL CONDITION. May get transferred to [facility] if beds available.   [facility], Emanuelle Bastos M.D on 06/12/2015 at 8:11 AM  Between 7am to 6pm - Pager - [phone]  After 6pm go to [email or web address] - password [email or web address] [facility]  [facility]  Office  [phone]  CC: Primary care physician; [facility]  Note: This dictation was prepared with Dragon dictation along with smaller phrase technology. Any transcriptional errors that result from this process are unintentional.

## 2015-06-12 NOTE — Evaluation (Signed)
Physical Therapy Evaluation Patient Details Name: Thomas [name] MRN: [id number] DOB: August 16, 1945 Today's Date: 06/12/2015   History of Present Illness  presented to ER with progressive SOB, cough; admitted with acute respiratory failure secondary to multi-focal HCAP.  Clinical Impression  Upon evaluation, patient alert and oriented to information; follows commands and demonstrates fair insight/judgement.  Agreeable to participation with session (for O2 qualifying information), but somewhat irritated by procedure.  Bilat UE/LE strength and ROM grossly WFL; denies pain. Able to complete sit/stand, basic transfers and gait (120-220') with RW, cga.  No buckling or LOB, but noted SOB with minimal exertion.  Completed 6 MWT with patient able to complete approx 340' total gait; consistent desat to 85% on 4L with BORG 7-8/10.  Requires seated rest period x2-3 min each break for recovery to >88% on 4L.  RN informed/aware. Anticipate need for home O2 upon discharge to maintain appropriate levels of oxygenation; will continue to monitor in subsequent sessions. Would benefit from skilled PT to address above deficits and promote optimal return to PLOF; Recommend transition to HHPT upon discharge from acute hospitalization.     Follow Up Recommendations Home health PT    Equipment Recommendations       Recommendations for Other Services       Precautions / Restrictions Precautions Precautions: Fall Restrictions Weight Bearing Restrictions: No      Mobility  Bed Mobility               General bed mobility comments: up in recliner beginning/end of session  Transfers Overall transfer level: Needs assistance Equipment used: Rolling walker (2 wheeled) Transfers: Sit to/from Stand Sit to Stand: Min guard;Supervision            Ambulation/Gait Ambulation/Gait assistance: Min guard Ambulation Distance (Feet): 200 Feet Assistive device: Rolling walker (2 wheeled)       General  Gait Details: broad BOS, forward flexed posture; fair cadence and gait speed without buckling or LOB.  Limited by SOB, BORG 7-8/10 after above distance, with noted desat to 85% on 4L  Stairs            Wheelchair Mobility    Modified Rankin (Stroke Patients Only)       Balance Overall balance assessment: Needs assistance Sitting-balance support: No upper extremity supported;Feet supported Sitting balance-Leahy Scale: Good     Standing balance support: Bilateral upper extremity supported Standing balance-Leahy Scale: Fair                               Pertinent Vitals/Pain Pain Assessment: No/denies pain    Home Living Family/patient expects to be discharged to:: Private residence Living Arrangements: Spouse/significant other Available Help at Discharge: Family;Available 24 hours/day Type of Home: House Home Access: Ramped entrance     Home Layout: One level        Prior Function Level of Independence: Independent with assistive device(s)         Comments: Indep for limited household mobility with use of 4WRW; does endorse single fall approx 1 week prior     Hand Dominance        Extremity/Trunk Assessment   Upper Extremity Assessment: Overall WFL for tasks assessed           Lower Extremity Assessment: Overall WFL for tasks assessed         Communication   Communication: No difficulties  Cognition Arousal/Alertness: Awake/alert Behavior During Therapy: WFL for tasks assessed/performed;Impulsive  Overall Cognitive Status: Within Functional Limits for tasks assessed                      General Comments      Exercises Other Exercises Other Exercises: Patient sats noted 89% at rest on 3L; increased to 4L (per RN) with activity. Other Exercises: : continuous gait for 2:36 min (approx 220') prior to need for seated rest, sats 85% on 4L, BORG 7/10.  Required approx 2 min seated rest before able to resume activity with  sats returning to 88% on 4L.  Able to complete remaining 1:30 min (120') of test without additional rest period.  At conclusion of test, sats again decreased to 85% on 4L, BORG 8/10 requiring 2-3 min seated rest period (and cuing for no talking) for recovery to 91%.  Total distance completed approx 340' with RW, cga; no buckling or LOB but significant fatigue/SOB.  continues to require supplemental O2 at rest and with exertion.  RN present to assess/monitor throughout.      Assessment/Plan    PT Assessment Patient needs continued PT services  PT Diagnosis Difficulty walking;Generalized weakness   PT Problem List Decreased strength;Decreased range of motion;Decreased activity tolerance;Decreased balance;Decreased mobility;Decreased coordination;Decreased cognition;Decreased knowledge of use of DME;Decreased safety awareness  PT Treatment Interventions Gait training;Stair training;Functional mobility training;Therapeutic activities;Therapeutic exercise;Balance training;Patient/family education;Cognitive remediation   PT Goals (Current goals can be found in the Care Plan section) Acute Rehab PT Goals Patient Stated Goal: Patient eager for return home PT Goal Formulation: With patient Time For Goal Achievement: 06/26/15 Potential to Achieve Goals: Fair    Frequency Min 2X/week   Barriers to discharge        Co-evaluation               End of Session Equipment Utilized During Treatment: Gait belt;Oxygen Activity Tolerance: Patient tolerated treatment well Patient left: in chair;with call bell/phone within reach;with chair alarm set Nurse Communication: Mobility status         Time: 4098-1191 PT Time Calculation (min) (ACUTE ONLY): 16 min   Charges:   PT Evaluation $Initial PT Evaluation Tier I: 1 Procedure PT Treatments $Gait Training: 8-22 mins   PT G Codes:       Eliazer Hemphill H. Manson Passey, PT, DPT, NCS 06/12/2015, 4:58 PM [phone]

## 2015-06-12 NOTE — Progress Notes (Signed)
Initial Nutrition Assessment     INTERVENTION:  Meals and snacks: Cater to pt preferences   NUTRITION DIAGNOSIS:    (none at this time) related to   as evidenced by  .    GOAL:   Patient will meet greater than or equal to 90% of their needs    MONITOR:    (Energy intake, )  REASON FOR ASSESSMENT:   Consult Assessment of nutrition requirement/status  ASSESSMENT:    Pt admitted with COPD exacerbation  Past Medical History  Diagnosis Date  . COPD (chronic obstructive pulmonary disease) (HCC)   . Diabetes mellitus without complication (HCC)   . Hypertension   . DVT (deep venous thrombosis) (HCC)   . Subdural hematoma (HCC)   . Paroxysmal atrial fibrillation (HCC)   . Asthma      Current Nutrition: pt reports eating 100% of meals ( 2 eggs this am, grits and fruit)  Food/Nutrition-Related History: pt reports good appetite prior to admission and tolerating well   Scheduled Medications:  . apixaban  5 mg Oral BID  . carvedilol  6.25 mg Oral BID  . diltiazem  360 mg Oral Daily  . gabapentin  300 mg Oral TID  . ipratropium-albuterol  3 mL Nebulization Q6H  . methylPREDNISolone (SOLU-MEDROL) injection  80 mg Intravenous Q12H  . piperacillin-tazobactam (ZOSYN)  IV  4.5 g Intravenous 3 times per day  . pravastatin  40 mg Oral QHS  . sodium chloride  3 mL Intravenous Q12H  . vancomycin  1,000 mg Intravenous Q12H        Electrolyte/Renal Profile and Glucose Profile:   Recent Labs Lab 06/10/15 1926 06/11/15 0634  NA 137 139  K 4.0 4.4  CL 101 101  CO2 27 30  BUN 10 12  CREATININE 1.33* 1.39*  CALCIUM 8.6* 8.4*  GLUCOSE 99 159*   Protein Profile:  Recent Labs Lab 06/10/15 1926  ALBUMIN 2.7*    Gastrointestinal Profile: Last BM:11/30   Nutrition-Focused Physical Exam Findings: Nutrition-Focused physical exam completed. Findings are WDL for fat depletion, muscle depletion, and edema.     Weight Change: stable wt   Diet Order:  Diet  heart healthy/carb modified Room service appropriate?: Yes; Fluid consistency:: Thin  Skin:   reviewed     Height:   Ht Readings from Last 1 Encounters:  06/10/15 6\' 1"  (1.854 m)    Weight:   Wt Readings from Last 1 Encounters:  06/10/15 255 lb (115.667 kg)    Ideal Body Weight:     BMI:  Body mass index is 33.65 kg/(m^2).   EDUCATION NEEDS:   No education needs identified at this time  LOW Care Level  Lilburn Straw B. Freida BusmanAllen, RD, LDN [phone] (pager)

## 2015-06-12 NOTE — Progress Notes (Signed)
Call back received form Dr.V- updated with pt's van trough level of 45 and told MD pharmacy was notified.

## 2015-06-12 NOTE — Progress Notes (Signed)
Thomas [name]     Assessment and Plan:  69 year old male male past medical history of COPD, DVT, hypertension, subdural hematoma, atrial fibrillation, recent admission for healthcare associated pneumonia now readmitted with multifocal/multilobar pneumonia and hypoxia.   Acute respiratory failure-hypoxia due to Healthcare associated pneumonia. OK to discharge from respiratory standpoint to complete abx course.   COPD exacerbation, doing better.  -Continue with nebulizers and steroids  ? OSA -BiPAP at night minimal 4 hours (patient is supposed to have an outpatient sleep study, this was ordered by the [place], but he has been avoiding it, there is high suspicion for obstructive sleep apnea)  -Maintain O2 saturations are 88%  -Physical therapy  -I believe that continuous noxious substances in his home, wife smoking indoors, burning wood, for heating and cooking is making recovery from COPD exacerbations and pneumonia more difficult. I also explained that his recovery will take several weeks and he will not go back to normal. Immediately after discharge.   Date: 06/12/2015  MRN# [id number] Thomas [name] 1946/04/02   Thomas [name] is a 69 y.o. old male seen in follow up for chief complaint of  Chief Complaint  Patient presents with  . Shortness of Breath     HPI:   No new complaints, the patient is anxious to be discharged.  Allergies:  Review of patient's allergies indicates no known allergies.  Review of Systems: Gen:  Denies  fever, sweats. HEENT: Denies blurred vision. Cvc:  No dizziness, chest pain or heaviness Resp:   Denies cough or sputum porduction. Gi: Denies swallowing difficulty,  Gu:  Denies bladder incontinence, burning urine Ext:   No Joint pain, stiffness. Skin: No skin rash, easy bruising. Endoc:  No polyuria, polydipsia. Psych: No depression, insomnia. Other:  All other systems were reviewed and found to be negative other than what is  mentioned in the HPI.   Physical Examination:   VS: BP 110/57 mmHg  Pulse 67  Temp(Src) 97.7 F (36.5 C) (Oral)  Resp 18  Ht  (1.854 m)  Wt 115.667 kg (255 lb)  BMI 33.65 kg/m2  SpO2 92%  General Appearance: No distress  Neuro:without focal findings,  speech normal,  HEENT: PERRLA, EOM intact. Pulmonary: normal breath sounds, No wheezing.   CardiovascularNormal S1,S2.  No m/r/g.   Abdomen: Benign, Soft, non-tender. Renal:  No costovertebral tenderness  GU:  Not performed at this time. Endoc: No evident thyromegaly, no signs of acromegaly. Skin:   warm, no rash. Extremities: normal, no cyanosis, clubbing.   LABORATORY PANEL:   CBC  Recent Labs Lab 06/11/15 0634  WBC 7.4  HGB 8.7*  HCT 27.3*  PLT 222   ------------------------------------------------------------------------------------------------------------------  Chemistries   Recent Labs Lab 06/10/15 1926 06/11/15 0634  NA 137 139  K 4.0 4.4  CL 101 101  CO2 27 30  GLUCOSE 99 159*  BUN 10 12  CREATININE 1.33* 1.39*  CALCIUM 8.6* 8.4*  AST 30  --   ALT 26  --   ALKPHOS 64  --   BILITOT 0.6  --    ------------------------------------------------------------------------------------------------------------------  Cardiac Enzymes  Recent Labs Lab 06/10/15 1926  TROPONINI <0.03   ------------------------------------------------------------  RADIOLOGY:   No results found for this or any previous visit. Results for orders placed during the hospital encounter of 05/31/15  DG Chest 2 View   Narrative CLINICAL DATA:  Congestion, weakness, COPD  EXAM: CHEST  2 VIEW  COMPARISON:  Portable chest x-ray of 05/30/2014  FINDINGS:  There is mild atelectasis or scarring at the lung bases. The lungs are somewhat hyperaerated. No active infiltrate or effusion is seen. There is a poorly defined nodular opacity in the right perihilar region. This could simply represent overlapping vasculature, but  a developing nodule cannot be excluded. Recommend either followup chest x-ray or CT of the chest to assess further. Mediastinal and hilar contours are unremarkable. The heart is mildly enlarged and stable. The bones are osteopenic and there are diffuse degenerative changes throughout the thoracic spine.  IMPRESSION: 1. Hyperaeration with probable basilar atelectasis or scarring. 2. Poorly defined nodular opacity in the right mid lung -perihilar region possibly due to overlapping vasculature, but consider follow-up chest x-ray or CT of the chest to assess further.   Electronically Signed   By: Dwyane DeePaul  Barry M.D.   On: 06/04/2015 08:08    ------------------------------------------------------------------------------------------------------------------  Thank  you for allowing [facility] to assist in the care of your patient. Our recommendations are noted above.  Please contact us if we can be of further service.   Wells Guileseep Montavious Wierzba, MD.  [facility] Office Number: [phone]  Santiago Gladavid Kasa, M.D.  Stephanie AcreVishal Mungal, M.D.  Billy Fischeravid Simonds, M.D

## 2015-06-13 DIAGNOSIS — J441 Chronic obstructive pulmonary disease with (acute) exacerbation: Secondary | ICD-10-CM | POA: Diagnosis not present

## 2015-06-13 DIAGNOSIS — J189 Pneumonia, unspecified organism: Secondary | ICD-10-CM | POA: Diagnosis not present

## 2015-06-13 LAB — CBC
HCT: 27.9 % — ABNORMAL LOW (ref 40.0–52.0)
Hemoglobin: 8.7 g/dL — ABNORMAL LOW (ref 13.0–18.0)
MCH: 27.8 pg (ref 26.0–34.0)
MCHC: 31.3 g/dL — ABNORMAL LOW (ref 32.0–36.0)
MCV: 88.8 fL (ref 80.0–100.0)
PLATELETS: 267 10*3/uL (ref 150–440)
RBC: 3.15 MIL/uL — ABNORMAL LOW (ref 4.40–5.90)
RDW: 15.9 % — AB (ref 11.5–14.5)
WBC: 20.9 10*3/uL — ABNORMAL HIGH (ref 3.8–10.6)

## 2015-06-13 LAB — BASIC METABOLIC PANEL
Anion gap: 11 (ref 5–15)
BUN: 34 mg/dL — AB (ref 6–20)
CALCIUM: 8.7 mg/dL — AB (ref 8.9–10.3)
CO2: 29 mmol/L (ref 22–32)
Chloride: 100 mmol/L — ABNORMAL LOW (ref 101–111)
Creatinine, Ser: 1.76 mg/dL — ABNORMAL HIGH (ref 0.61–1.24)
GFR calc Af Amer: 44 mL/min — ABNORMAL LOW (ref >60)
GFR, EST NON AFRICAN AMERICAN: 38 mL/min — AB (ref >60)
GLUCOSE: 161 mg/dL — AB (ref 65–99)
Potassium: 4.6 mmol/L (ref 3.5–5.1)
Sodium: 140 mmol/L (ref 135–145)

## 2015-06-13 LAB — VANCOMYCIN, TROUGH
VANCOMYCIN TR: 20 ug/mL (ref 10–20)
Vancomycin Tr: 29 ug/mL (ref 10–20)

## 2015-06-13 LAB — GLUCOSE, CAPILLARY
GLUCOSE-CAPILLARY: 146 mg/dL — AB (ref 65–99)
GLUCOSE-CAPILLARY: 130 mg/dL — AB (ref 65–99)
Glucose-Capillary: 162 mg/dL — ABNORMAL HIGH (ref 65–99)
Glucose-Capillary: 149 mg/dL — ABNORMAL HIGH (ref 65–99)

## 2015-06-13 MED ORDER — FLUTICASONE PROPIONATE 50 MCG/ACT NA SUSP
2.0000 | Freq: Every day | NASAL | Status: DC
  Administered 2015-06-13 – 2015-06-16 (×4): 2 via NASAL
  Filled 2015-06-13: qty 16

## 2015-06-13 MED ORDER — VANCOMYCIN HCL IN DEXTROSE 1-5 GM/200ML-% IV SOLN
1000.0000 mg | INTRAVENOUS | Status: DC
  Filled 2015-06-13: qty 200

## 2015-06-13 MED ORDER — LINEZOLID 600 MG/300ML IV SOLN
600.0000 mg | Freq: Two times a day (BID) | INTRAVENOUS | Status: DC
  Administered 2015-06-13 – 2015-06-15 (×4): 600 mg via INTRAVENOUS
  Filled 2015-06-13 (×5): qty 300

## 2015-06-13 NOTE — Care Management (Addendum)
I have [facility]. [facility]. [facility].

## 2015-06-13 NOTE — Progress Notes (Signed)
[facility]     Assessment and Plan:  69 year old male male past medical history of COPD, DVT, hypertension, subdural hematoma, atrial fibrillation, recent admission for healthcare associated pneumonia now readmitted with multifocal/multilobar pneumonia and hypoxia.   Acute respiratory failure-hypoxia due to Healthcare associated pneumonia. OK to discharge from respiratory standpoint to complete abx course.   COPD exacerbation, doing better.  -Continue with nebulizers and steroids  ? OSA -patient is supposed to have an outpatient sleep study, this was ordered by the [place], but he has been avoiding it, there is high suspicion for obstructive sleep apnea)  -Maintain O2 saturations are 88%  -Physical therapy  Pt was given our card and asked to follow up outpatient or to be seen by his outpatient pulmonary doctor in the next month.   Pulmonary service will sign off for now. Please call if there are any further questions or concerns.  Date: 06/13/2015  MRN# [id number] Thomas [name] 08/16/1945   Thomas [name] is a 69 y.o. old male seen in follow up for chief complaint of  Chief Complaint  Patient presents with  . Shortness of Breath     HPI:   No new complaints, the patient is anxious to be discharged.  Allergies:  Review of patient's allergies indicates no known allergies.  Review of Systems: Gen:  Denies  fever, sweats. HEENT: Denies blurred vision. Cvc:  No dizziness, chest pain or heaviness Resp:   Denies cough or sputum porduction. Gi: Denies swallowing difficulty,  Gu:  Denies bladder incontinence, burning urine Ext:   No Joint pain, stiffness. Skin: No skin rash, easy bruising. Endoc:  No polyuria, polydipsia. Psych: No depression, insomnia. Other:  All other systems were reviewed and found to be negative other than what is mentioned in the HPI.   Physical Examination:   VS: BP 125/61 mmHg  Pulse 71  Temp(Src) 97.6 F (36.4 C) (Oral)   Resp 18  Ht  (1.854 m)  Wt 115.667 kg (255 lb)  BMI 33.65 kg/m2  SpO2 95%  General Appearance: No distress  Neuro:without focal findings,  speech normal,  HEENT: PERRLA, EOM intact. Pulmonary: normal breath sounds, No wheezing.   CardiovascularNormal S1,S2.  No m/r/g.   Abdomen: Benign, Soft, non-tender. Renal:  No costovertebral tenderness  GU:  Not performed at this time. Endoc: No evident thyromegaly, no signs of acromegaly. Skin:   warm, no rash. Extremities: normal, no cyanosis, clubbing.   LABORATORY PANEL:   CBC  Recent Labs Lab 06/13/15 0544  WBC 20.9*  HGB 8.7*  HCT 27.9*  PLT 267   ------------------------------------------------------------------------------------------------------------------  Chemistries   Recent Labs Lab 06/10/15 1926  06/13/15 0544  NA 137  < > 140  K 4.0  < > 4.6  CL 101  < > 100*  CO2 27  < > 29  GLUCOSE 99  < > 161*  BUN 10  < > 34*  CREATININE 1.33*  < > 1.76*  CALCIUM 8.6*  < > 8.7*  AST 30  --   --   ALT 26  --   --   ALKPHOS 64  --   --   BILITOT 0.6  --   --   < > = values in this interval not displayed. ------------------------------------------------------------------------------------------------------------------  Cardiac Enzymes  Recent Labs Lab 06/10/15 1926  TROPONINI <0.03   ------------------------------------------------------------  RADIOLOGY:   No results found for this or any previous visit. Results for orders placed during the hospital encounter  of 05/31/15  DG Chest 2 View   Narrative CLINICAL DATA:  Congestion, weakness, COPD  EXAM: CHEST  2 VIEW  COMPARISON:  Portable chest x-ray of 05/30/2014  FINDINGS: There is mild atelectasis or scarring at the lung bases. The lungs are somewhat hyperaerated. No active infiltrate or effusion is seen. There is a poorly defined nodular opacity in the right perihilar region. This could simply represent overlapping vasculature, but a developing  nodule cannot be excluded. Recommend either followup chest x-ray or CT of the chest to assess further. Mediastinal and hilar contours are unremarkable. The heart is mildly enlarged and stable. The bones are osteopenic and there are diffuse degenerative changes throughout the thoracic spine.  IMPRESSION: 1. Hyperaeration with probable basilar atelectasis or scarring. 2. Poorly defined nodular opacity in the right mid lung -perihilar region possibly due to overlapping vasculature, but consider follow-up chest x-ray or CT of the chest to assess further.   Electronically Signed   By: Dwyane DeePaul  Barry M.D.   On: 06/04/2015 08:08    ------------------------------------------------------------------------------------------------------------------  Thank  you for allowing [facility] to assist in the care of your patient. Our recommendations are noted above.  Please contact us if we can be of further service.   Wells Guileseep Lelani Garnett, MD.  [place]  Santiago Gladavid Kasa, M.D.  Stephanie AcreVishal Mungal, M.D.  Billy Fischeravid Simonds, M.D

## 2015-06-13 NOTE — Progress Notes (Signed)
ANTIBIOTIC CONSULT NOTE - Follow up  Pharmacy Consult for vancomycin and Zosyn dosing Indication: HCAP  No Known Allergies  Patient Measurements: Height:  (185.4 cm) Weight: 255 lb (115.667 kg) IBW/kg (Calculated) : 79.9 Adjusted Body Weight: 94 kg  Vital Signs: Temp: 98.2 F (36.8 C) (12/01 0342) Temp Source: Oral (12/01 0342) BP: 100/51 mmHg (12/01 0342) Pulse Rate: 77 (12/01 0342) Intake/Output from previous day: 11/30 0701 - 12/01 0700 In: 943 [P.O.:840; I.V.:3; IV Piggyback:100] Out: -  Intake/Output from this shift: Total I/O In: 103 [I.V.:3; IV Piggyback:100] Out: -   Labs:  Recent Labs  06/10/15 1926 06/11/15 0634 06/12/15 1745 06/13/15 0544  WBC 9.3 7.4  --  20.9*  HGB 8.5* 8.7*  --  8.7*  PLT 234 222  --  267  CREATININE 1.33* 1.39* 1.78* 1.76*   Estimated Creatinine Clearance: 53.5 mL/min (by C-G formula based on Cr of 1.76).  Recent Labs  06/12/15 1745 06/13/15 0544  VANCOTROUGH 45* 29*     Microbiology: Recent Results (from the past 720 hour(s))  Culture, blood (routine x 2)     Status: None   Collection Time: 05/31/15  9:20 PM  Result Value Ref Range Status   Specimen Description BLOOD LEFT ASSIST CONTROL  Final   Special Requests BOTTLES DRAWN AEROBIC AND ANAEROBIC 4CC  Final   Culture  Setup Time   Final    GRAM POSITIVE RODS AEROBIC BOTTLE ONLY CRITICAL RESULT CALLED TO, READ BACK BY AND VERIFIED WITH: MARCEL TURNER AT 2353 ON 06/01/15 [facility] CONFIRMED BY [facility]    Culture   Final    GRAM POSITIVE RODS AEROBIC BOTTLE ONLY Results consistent with contamination.    Report Status 06/05/2015 FINAL  Final  Culture, blood (routine x 2)     Status: None   Collection Time: 05/31/15  9:20 PM  Result Value Ref Range Status   Specimen Description BLOOD RIGHT ASSIST CONTROL  Final   Special Requests BOTTLES DRAWN AEROBIC AND ANAEROBIC 4CC  Final   Culture NO GROWTH 5 DAYS  Final   Report Status 06/05/2015 FINAL  Final  C difficile quick  scan w PCR reflex     Status: None   Collection Time: 06/11/15  9:04 AM  Result Value Ref Range Status   C Diff antigen NEGATIVE NEGATIVE Final   C Diff toxin NEGATIVE NEGATIVE Final   C Diff interpretation Negative for C. difficile  Corrected    Comment: CORRECTED ON 11/29 AT 1135: PREVIOUSLY REPORTED AS VALID    Medical History: Past Medical History  Diagnosis Date  . COPD (chronic obstructive pulmonary disease) (HCC)   . Diabetes mellitus without complication (HCC)   . Hypertension   . DVT (deep venous thrombosis) (HCC)   . Subdural hematoma (HCC)   . Paroxysmal atrial fibrillation (HCC)   . Asthma     Medications:   Assessment: 69 yo male readmitted for HCAP  Ke 0.052, half life 13 h, Vd 66 L  Goal of Therapy:  Vancomycin trough level 15-20 mcg/ml  Plan:  SCr trended up a little, current dosing of Vancomycin 1250 mg IV q 12 hours ordered with stacked dosing may result in high trough, will decrease dose to vancomycin 1000 mg IV q12h for expected trough of ~17-18. Level before 4th dose - 11/30 at 1730 with SCr. Will need to continue to monitor renal function as pt at risk for accumulation. If renal function continues to worsen, will need to extend out dosing interval.  Zosyn 4.5 grams q 8 hours ordered due to Pseudomonas risk of recent hospitalization with abx received.  11/30:  Vanc trough @ 17:30 = 45 mcg/mL ,   Lab drew vanc sample from same line as vanc was infusing.   Will reorder Vanc trough before next dose on 12/1 @ 5:30.   12/1 0530 vanc level 29. Changed to q 24 hours. Level ordered before next dose doe at 1800 today.   Joan Avetisyan S 06/13/2015,6:37 AM

## 2015-06-13 NOTE — Evaluation (Signed)
Occupational Therapy Evaluation Patient Details Name: Thomas [name] MRN: [id number] DOB: 13-Feb-1946 Today's Date: 06/13/2015    History of Present Illness presented to ER with progressive SOB, cough; admitted with acute respiratory failure secondary to multi-focal HCAP.   Clinical Impression   Patient is a 69 year old male who came to [facility] with the above history. He lives in a mobile home with ramp entrance.  She had been independent with basic activities of daily living and functional mobility using a cane. He does get shortness of breath during many activities and would benefit from Occupational Therapy for energy saving techniques.    Follow Up Recommendations   (be available )    Equipment Recommendations       Recommendations for Other Services       Precautions / Restrictions Restrictions Weight Bearing Restrictions: No      Mobility Bed Mobility                  Transfers                      Balance                                            ADL                                         General ADL Comments: Patient had been independent with BADL but becomse short of breath with many activities. Practiced techniques for lower body dressing using hip kit to prevent shortness of breath. Patient practiced Donned/doffed socks and pants to knees with cues for techniques to prevent shortness of breath.     Vision     Perception     Praxis      Pertinent Vitals/Pain       Hand Dominance     Extremity/Trunk Assessment Upper Extremity Assessment Upper Extremity Assessment: Overall WFL for tasks assessed   Lower Extremity Assessment Lower Extremity Assessment: Defer to PT evaluation       Communication Communication Communication: No difficulties   Cognition Arousal/Alertness: Awake/alert                     General Comments       Exercises       Shoulder  Instructions      Home Living Family/patient expects to be discharged to:: Private residence Living Arrangements: Spouse/significant other Available Help at Discharge: Family;Available 24 hours/day Type of Home: Mobile home Home Access: Ramped entrance     Home Layout: One level, ramp entrance.                          Prior Functioning/Environment Level of Independence: Independent with assistive device(s)        Comments: Indep for limited household mobility with use of 4WRW; does endorse single fall approx 1 week prior    OT Diagnosis: Generalized weakness   OT Problem List: Decreased activity tolerance;Decreased knowledge of use of DME or AE;Pain   OT Treatment/Interventions:      OT Goals(Current goals can be found in the care plan section) Acute Rehab OT Goals Patient Stated Goal: Wants to go home OT Goal  Formulation: With patient/family Time For Goal Achievement: 06/27/15 Potential to Achieve Goals: Good  OT Frequency:     Barriers to D/C:            Co-evaluation              End of Session Equipment Utilized During Treatment:  (hip kit)  Activity Tolerance:   Patient left: in chair;with nursing/sitter in room;with family/visitor present   Time: 0063-4949 OT Time Calculation (min): 20 min Charges:  OT General Charges $OT Visit: 1 Procedure OT Evaluation $Initial OT Evaluation Tier I: 1 Procedure OT Treatments $Self Care/Home Management : 8-22 mins G-Codes:    Myrene Galas, MS/OTR/L  06/13/2015, 11:32 AM

## 2015-06-13 NOTE — Progress Notes (Signed)
[facility] [place] at [facility]   PATIENT NAME: Thomas [name]    MR#:  [id number]  DATE OF BIRTH:  [birth year]  SUBJECTIVE:  CHIEF COMPLAINT:   Chief Complaint  Patient presents with  . Shortness of Breath  sitting in chair, eating break-fast. No new issues - feeling much better REVIEW OF SYSTEMS:  Review of Systems  Constitutional: Negative for fever, weight loss, malaise/fatigue and diaphoresis.  HENT: Negative for ear discharge, ear pain, hearing loss, nosebleeds, sore throat and tinnitus.   Eyes: Negative for blurred vision and pain.  Respiratory: Positive for cough and shortness of breath. Negative for hemoptysis and wheezing.   Cardiovascular: Negative for chest pain, palpitations, orthopnea and leg swelling.  Gastrointestinal: Negative for heartburn, nausea, vomiting, abdominal pain, diarrhea, constipation and blood in stool.  Genitourinary: Negative for dysuria, urgency and frequency.  Musculoskeletal: Negative for myalgias and back pain.  Skin: Negative for itching and rash.  Neurological: Negative for dizziness, tingling, tremors, focal weakness, seizures, weakness and headaches.  Psychiatric/Behavioral: Negative for depression. The patient is not nervous/anxious.     DRUG ALLERGIES:  No Known Allergies VITALS:  Blood pressure 113/59, pulse 71, temperature 97.6 F (36.4 C), temperature source Oral, resp. rate 18, height  (1.854 m), weight 115.667 kg (255 lb), SpO2 99 %. PHYSICAL EXAMINATION:  Physical Exam  Constitutional: He is oriented to person, place, and time and well-developed, well-nourished, and in no distress.  HENT:  Head: Normocephalic and atraumatic.  Eyes: Conjunctivae and EOM are normal. Pupils are equal, round, and reactive to light.  Neck: Normal range of motion. Neck supple. No tracheal deviation present. No thyromegaly present.  Cardiovascular: Normal rate, regular rhythm and normal heart sounds.   Pulmonary/Chest:  Effort normal and breath sounds normal. No respiratory distress. He has no wheezes. He exhibits no tenderness.  Abdominal: Soft. Bowel sounds are normal. He exhibits no distension. There is no tenderness.  Musculoskeletal: Normal range of motion.  Neurological: He is alert and oriented to person, place, and time. No cranial nerve deficit.  Skin: Skin is warm and dry. No rash noted.  Psychiatric: Mood and affect normal.   LABORATORY PANEL:   CBC  Recent Labs Lab 06/13/15 0544  WBC 20.9*  HGB 8.7*  HCT 27.9*  PLT 267   ------------------------------------------------------------------------------------------------------------------ Chemistries   Recent Labs Lab 06/10/15 1926  06/13/15 0544  NA 137  < > 140  K 4.0  < > 4.6  CL 101  < > 100*  CO2 27  < > 29  GLUCOSE 99  < > 161*  BUN 10  < > 34*  CREATININE 1.33*  < > 1.76*  CALCIUM 8.6*  < > 8.7*  AST 30  --   --   ALT 26  --   --   ALKPHOS 64  --   --   BILITOT 0.6  --   --   < > = values in this interval not displayed. RADIOLOGY:  No results found. ASSESSMENT AND PLAN:  69 y.o. male who presented with persistent and progressive shortness of breath, as well as cough and wheezing admitted for  * Healthcare-associated pneumonia -Continue Vanco and zosyn. neg Blood cultures and sputum culture.  * COPD exacerbation (chronic obstructive pulmonary disease) (HCC) - continue IV steroids and DuoNeb's plus antibiotics as above. Appreciate Pulmo input. Continue with current antibiotics of vancomycin and Zosyn - BiPAP at night minimal 4 hours  -Maintain O2 saturations are 88% per PCCM -Review  of the CT shows chronic emphysematous changes along with lower lobe scarring, may be some component of aspiration - may need barium swallow study for further evaluation of possible aspiration - overall poor psycho-social situations making him high risk for recurrent admissions.  * HTN (hypertension) - continue coreg, cardizem,   *  Type 2 diabetes mellitus (HCC) - not on any anti-glycemic medications at home. Hemoglobin A1c 6.1. monitor  * Paroxysmal a-fib (HCC) - continue home rate controlling medication as well as anticoagulation. On Eliquis and cardizem, coreg.   [facility] than [facility].   All the records are reviewed and case discussed with Care Management/Social Worker. Management plans discussed with the patient, family and they are in agreement.  CODE STATUS: Full Code  TOTAL TIME TAKING CARE OF THIS PATIENT: 35 minutes.   More than 50% of the time was spent in counseling/coordination of care: YES  POSSIBLE D/C IN 1-2 DAYS, DEPENDING ON CLINICAL CONDITION. [facility].   [facility], Dreydon Cardenas M.D on 06/13/2015 at 8:37 AM  Between 7am to 6pm - Pager - [phone]  After 6pm go to [email or web address] - password [email or web address] [facility]  [facility]  Office  [phone]  CC: Primary care physician; [facility]  Note: This dictation was prepared with Dragon dictation along with smaller phrase technology. Any transcriptional errors that result from this process are unintentional.

## 2015-06-13 NOTE — Care Management (Signed)
[facility] [place] [facility] [facility] and she received my fax. She will submit O2 request to [employer] and will coordinate O2 with ptaient's wife Pam at [phone] and deliver a portable tank to transport patient home tomorrow. Patient has been notified by this RNCM .

## 2015-06-13 NOTE — Plan of Care (Signed)
Problem: Physical Regulation: Goal: Ability to maintain clinical measurements within normal limits will improve Outcome: Completed/Met Date Met:  06/13/15 Patient has been weaned down to 3L spo2. Patient ambulates to BR 1 assist. No complaints of SOB or dyspnea.

## 2015-06-13 NOTE — Progress Notes (Signed)
Patient states he does not want to use the BIPAP at night. BIPAP not set up in room. Patient states he does not wear a BIPAP or CPAP at home.

## 2015-06-13 NOTE — Progress Notes (Addendum)
SPo2 on room air at rest is 85% . The LPM needed to keep SPo2 at 88% is 3L. When Oxygen is applied at rest and titrated with exertion, the SPO2 unable to obtain without O@ decreasing to low 80's . 3L of O2 is needed at rest and 4L of o2 is needed with exertion. Patient ambulated 22420ft, HR 85, RR 22 and SPO2 88% 4l for 6 minutes.

## 2015-06-13 NOTE — Progress Notes (Addendum)
Patient refusing iv antibiotic - zosyn and vancomycin, states " its effecting my kidney". spoke with Dr.Vaickute regarding pt concern. order received for zyvox 600 mg  2 x a day and to discontinue the vancomycin. Orders received for nephrology consult for elevated creatine and infectious disease consult for bacteremia. Orders placed.

## 2015-06-14 LAB — COMPREHENSIVE METABOLIC PANEL
ALBUMIN: 2.6 g/dL — AB (ref 3.5–5.0)
ALT: 22 U/L (ref 17–63)
AST: 29 U/L (ref 15–41)
Alkaline Phosphatase: 58 U/L (ref 38–126)
Anion gap: 12 (ref 5–15)
BILIRUBIN TOTAL: 0.5 mg/dL (ref 0.3–1.2)
BUN: 41 mg/dL — AB (ref 6–20)
CALCIUM: 8.1 mg/dL — AB (ref 8.9–10.3)
CHLORIDE: 94 mmol/L — AB (ref 101–111)
CO2: 28 mmol/L (ref 22–32)
Creatinine, Ser: 2.11 mg/dL — ABNORMAL HIGH (ref 0.61–1.24)
GFR calc Af Amer: 35 mL/min — ABNORMAL LOW (ref >60)
GFR calc non Af Amer: 31 mL/min — ABNORMAL LOW (ref >60)
GLUCOSE: 144 mg/dL — AB (ref 65–99)
POTASSIUM: 4 mmol/L (ref 3.5–5.1)
Sodium: 134 mmol/L — ABNORMAL LOW (ref 135–145)
TOTAL PROTEIN: 6.6 g/dL (ref 6.5–8.1)

## 2015-06-14 LAB — CBC
HEMATOCRIT: 28.2 % — AB (ref 40.0–52.0)
Hemoglobin: 8.8 g/dL — ABNORMAL LOW (ref 13.0–18.0)
MCH: 27.4 pg (ref 26.0–34.0)
MCHC: 31.1 g/dL — ABNORMAL LOW (ref 32.0–36.0)
MCV: 88.1 fL (ref 80.0–100.0)
PLATELETS: 263 10*3/uL (ref 150–440)
RBC: 3.2 MIL/uL — ABNORMAL LOW (ref 4.40–5.90)
RDW: 16 % — AB (ref 11.5–14.5)
WBC: 20.7 10*3/uL — AB (ref 3.8–10.6)

## 2015-06-14 LAB — PROTEIN / CREATININE RATIO, URINE
Creatinine, Urine: 52 mg/dL
Protein Creatinine Ratio: 0.12 mg/mg{Cre} (ref 0.00–0.15)
Total Protein, Urine: 6 mg/dL

## 2015-06-14 LAB — GLUCOSE, CAPILLARY
GLUCOSE-CAPILLARY: 119 mg/dL — AB (ref 65–99)
GLUCOSE-CAPILLARY: 138 mg/dL — AB (ref 65–99)
GLUCOSE-CAPILLARY: 145 mg/dL — AB (ref 65–99)
GLUCOSE-CAPILLARY: 152 mg/dL — AB (ref 65–99)

## 2015-06-14 MED ORDER — SALINE SPRAY 0.65 % NA SOLN
1.0000 | NASAL | Status: DC | PRN
  Filled 2015-06-14: qty 44

## 2015-06-14 MED ORDER — SODIUM CHLORIDE 0.9 % IV SOLN
INTRAVENOUS | Status: DC
  Administered 2015-06-14: 18:00:00 via INTRAVENOUS

## 2015-06-14 NOTE — Consult Note (Signed)
[facility] Infectious Disease     Reason for Consult: HCAP    Referring Physician: Max Sane Date of Admission:  06/10/2015   Principal Problem:   Healthcare-associated pneumonia Active Problems:   HTN (hypertension)   Type 2 diabetes mellitus (HCC)   Paroxysmal a-fib (HCC)   COPD exacerbation (HCC)   HPI: Thomas [name] is a 69 y.o. male with advanced COPD admitted 11/28 with progressive SOB and cough. He had been admitted 11/18-11/22 with fall, confusion and possible HCAP. Treated with vanco zosyn then changed to levo at dc.  Also had prolonged admission 10/30-11/18 at [facility] with need for mechanical ventilation. He also had DVT and Small SDH dxed at that time.  Since this admission had been started on vanco and zosyn. No sputum production or fevers. Has been afebrile but wbc increasing - is on high dose of steroids.  Today he refused abx as he is concerned re his kidneys. He had CKD and had cr up to 3 at [facility]. Today it is 2.11 (up from 1.39 at admit) CT scan shows impressive COPD and some bil basal infiltrate.   Cx 11/1 at [facility] grew only candida and mixed flora.  [place] neg. CSF cx negative. LP done and negative. Flu neg at [facility] and HSV negative on CSF. Did have _ rhinovirus PCR on admission 10/31.  HIV testing negative.   Past Medical History  Diagnosis Date  . COPD (chronic obstructive pulmonary disease) ([place])   . Diabetes mellitus without complication ([place])   . Hypertension   . DVT (deep venous thrombosis) ([place])   . Subdural hematoma ([place])   . Paroxysmal atrial fibrillation (HCC)   . Asthma    Past Surgical History  Procedure Laterality Date  . Coronary stent placement    . Ivc filter placement ([facility] hx)     Social History  Substance Use Topics  . Smoking status: Former Research scientist (life sciences)  . Smokeless tobacco: None  . Alcohol Use: No   Family History  Problem Relation Age of Onset  . Hypertension Other   . Stroke Mother   . Heart failure Father   . Cancer Father      Allergies: No Known Allergies  Current antibiotics: Antibiotics Given (last 72 hours)    Date/Time Action Medication Dose Rate   06/11/15 2040 Given   vancomycin (VANCOCIN) IVPB 1000 mg/200 mL premix 1,000 mg 200 mL/hr   06/11/15 2135 Given   piperacillin-tazobactam (ZOSYN) IVPB 4.5 g 4.5 g 200 mL/hr   06/12/15 0610 Given   piperacillin-tazobactam (ZOSYN) IVPB 4.5 g 4.5 g 200 mL/hr   06/12/15 0610 Given   vancomycin (VANCOCIN) IVPB 1000 mg/200 mL premix 1,000 mg 200 mL/hr   06/12/15 1404 Given   piperacillin-tazobactam (ZOSYN) IVPB 4.5 g 4.5 g 200 mL/hr   06/12/15 1717 Given   vancomycin (VANCOCIN) IVPB 1000 mg/200 mL premix 1,000 mg 200 mL/hr   06/12/15 2123 Given   piperacillin-tazobactam (ZOSYN) IVPB 4.5 g 4.5 g 200 mL/hr   06/13/15 0650 Given   piperacillin-tazobactam (ZOSYN) IVPB 4.5 g 4.5 g 200 mL/hr   06/13/15 1315 Given   piperacillin-tazobactam (ZOSYN) IVPB 4.5 g 4.5 g 200 mL/hr   06/13/15 2111 Given   linezolid (ZYVOX) IVPB 600 mg 600 mg 300 mL/hr   06/14/15 1145 Given   linezolid (ZYVOX) IVPB 600 mg 600 mg 300 mL/hr      MEDICATIONS: . apixaban  5 mg Oral BID  . carvedilol  6.25 mg Oral BID  . diltiazem  360 mg Oral Daily  . fluticasone  2 spray Each Nare Daily  . gabapentin  300 mg Oral TID  . linezolid (ZYVOX) IV  600 mg Intravenous Q12H  . methylPREDNISolone (SOLU-MEDROL) injection  80 mg Intravenous Q12H  . piperacillin-tazobactam (ZOSYN)  IV  4.5 g Intravenous 3 times per day  . pravastatin  40 mg Oral QHS  . sodium chloride  3 mL Intravenous Q12H    Review of Systems - 11 systems reviewed and negative per HPI   OBJECTIVE: Temp:  [97.3 F (36.3 C)-97.7 F (36.5 C)] 97.6 F (36.4 C) (12/02 0730) Pulse Rate:  [63-87] 64 (12/02 0730) Resp:  [18-20] 19 (12/02 0730) BP: (118-160)/(60-92) 118/92 mmHg (12/02 0730) SpO2:  [95 %-100 %] 95 % (12/02 0730) Physical Exam  Constitutional: He is oriented to person, place, and time. He appears  well-developed and well-nourished. No distress.  HENT: L facial droop - per pt old from [place] in [place] Mouth/Throat: Oropharynx is clear and moist. No oropharyngeal exudate.  Cardiovascular: Normal rate, regular rhythm and normal heart sounds. Pulmonary/Chest: poor air movement - no crackles Abdominal: Soft. Bowel sounds are normal. He exhibits no distension. There is no tenderness.  Lymphadenopathy: He has no cervical adenopathy.  Neurological: He is alert and oriented to person, place, and time.  Skin: Skin is warm and dry. No rash noted. No erythema.  Psychiatric: He has a normal mood and affect. His behavior is normal.   LABS: Results for orders placed or performed during the hospital encounter of 06/10/15 (from the past 48 hour(s))  Glucose, capillary     Status: Abnormal   Collection Time: 06/12/15  3:59 PM  Result Value Ref Range   Glucose-Capillary 152 (H) 65 - 99 mg/dL   Comment 1 Notify RN   Vancomycin, trough     Status: Abnormal   Collection Time: 06/12/15  5:45 PM  Result Value Ref Range   Vancomycin Tr 45 (HH) 10 - 20 ug/mL    Comment: CRITICAL RESULT CALLED TO, READ BACK BY AND VERIFIED WITH BHAVINI PATEL AT 1826 06/12/15 MLZ   Creatinine, serum     Status: Abnormal   Collection Time: 06/12/15  5:45 PM  Result Value Ref Range   Creatinine, Ser 1.78 (H) 0.61 - 1.24 mg/dL   GFR calc non Af Amer 37 (L) >60 mL/min   GFR calc Af Amer 43 (L) >60 mL/min    Comment: (NOTE) The eGFR has been calculated using the CKD EPI equation. This calculation has not been validated in all clinical situations. eGFR's persistently <60 mL/min signify possible Chronic Kidney Disease.   Glucose, capillary     Status: Abnormal   Collection Time: 06/12/15  8:42 PM  Result Value Ref Range   Glucose-Capillary 143 (H) 65 - 99 mg/dL   Comment 1 Notify RN   CBC     Status: Abnormal   Collection Time: 06/13/15  5:44 AM  Result Value Ref Range   WBC 20.9 (H) 3.8 - 10.6 K/uL   RBC 3.15 (L)  4.40 - 5.90 MIL/uL   Hemoglobin 8.7 (L) 13.0 - 18.0 g/dL   HCT 27.9 (L) 40.0 - 52.0 %   MCV 88.8 80.0 - 100.0 fL   MCH 27.8 26.0 - 34.0 pg   MCHC 31.3 (L) 32.0 - 36.0 g/dL   RDW 15.9 (H) 11.5 - 14.5 %   Platelets 267 150 - 440 K/uL  Basic metabolic panel     Status: Abnormal   Collection Time:  06/13/15  5:44 AM  Result Value Ref Range   Sodium 140 135 - 145 mmol/L   Potassium 4.6 3.5 - 5.1 mmol/L   Chloride 100 (L) 101 - 111 mmol/L   CO2 29 22 - 32 mmol/L   Glucose, Bld 161 (H) 65 - 99 mg/dL   BUN 34 (H) 6 - 20 mg/dL   Creatinine, Ser 1.76 (H) 0.61 - 1.24 mg/dL   Calcium 8.7 (L) 8.9 - 10.3 mg/dL   GFR calc non Af Amer 38 (L) >60 mL/min   GFR calc Af Amer 44 (L) >60 mL/min    Comment: (NOTE) The eGFR has been calculated using the CKD EPI equation. This calculation has not been validated in all clinical situations. eGFR's persistently <60 mL/min signify possible Chronic Kidney Disease.    Anion gap 11 5 - 15  Vancomycin, trough     Status: Abnormal   Collection Time: 06/13/15  5:44 AM  Result Value Ref Range   Vancomycin Tr 29 (HH) 10 - 20 ug/mL    Comment: CRITICAL RESULT CALLED TO, READ BACK BY AND VERIFIED WITH MATT MCBANE AT 3220 06/13/15 WDM   Glucose, capillary     Status: Abnormal   Collection Time: 06/13/15  7:36 AM  Result Value Ref Range   Glucose-Capillary 146 (H) 65 - 99 mg/dL   Comment 1 Notify RN   Glucose, capillary     Status: Abnormal   Collection Time: 06/13/15 11:18 AM  Result Value Ref Range   Glucose-Capillary 162 (H) 65 - 99 mg/dL   Comment 1 Notify RN   Glucose, capillary     Status: Abnormal   Collection Time: 06/13/15  3:29 PM  Result Value Ref Range   Glucose-Capillary 149 (H) 65 - 99 mg/dL   Comment 1 Notify RN   Vancomycin, trough     Status: None   Collection Time: 06/13/15  4:58 PM  Result Value Ref Range   Vancomycin Tr 20 10 - 20 ug/mL  Glucose, capillary     Status: Abnormal   Collection Time: 06/13/15  8:52 PM  Result Value Ref  Range   Glucose-Capillary 130 (H) 65 - 99 mg/dL  CBC     Status: Abnormal   Collection Time: 06/14/15  4:32 AM  Result Value Ref Range   WBC 20.7 (H) 3.8 - 10.6 K/uL   RBC 3.20 (L) 4.40 - 5.90 MIL/uL   Hemoglobin 8.8 (L) 13.0 - 18.0 g/dL   HCT 28.2 (L) 40.0 - 52.0 %   MCV 88.1 80.0 - 100.0 fL   MCH 27.4 26.0 - 34.0 pg   MCHC 31.1 (L) 32.0 - 36.0 g/dL   RDW 16.0 (H) 11.5 - 14.5 %   Platelets 263 150 - 440 K/uL  Comprehensive metabolic panel     Status: Abnormal   Collection Time: 06/14/15  4:32 AM  Result Value Ref Range   Sodium 134 (L) 135 - 145 mmol/L   Potassium 4.0 3.5 - 5.1 mmol/L   Chloride 94 (L) 101 - 111 mmol/L   CO2 28 22 - 32 mmol/L   Glucose, Bld 144 (H) 65 - 99 mg/dL   BUN 41 (H) 6 - 20 mg/dL   Creatinine, Ser 2.11 (H) 0.61 - 1.24 mg/dL   Calcium 8.1 (L) 8.9 - 10.3 mg/dL   Total Protein 6.6 6.5 - 8.1 g/dL   Albumin 2.6 (L) 3.5 - 5.0 g/dL   AST 29 15 - 41 U/L   ALT 22 17 - 63  U/L   Alkaline Phosphatase 58 38 - 126 U/L   Total Bilirubin 0.5 0.3 - 1.2 mg/dL   GFR calc non Af Amer 31 (L) >60 mL/min   GFR calc Af Amer 35 (L) >60 mL/min    Comment: (NOTE) The eGFR has been calculated using the CKD EPI equation. This calculation has not been validated in all clinical situations. eGFR's persistently <60 mL/min signify possible Chronic Kidney Disease.    Anion gap 12 5 - 15  Glucose, capillary     Status: Abnormal   Collection Time: 06/14/15  7:27 AM  Result Value Ref Range   Glucose-Capillary 119 (H) 65 - 99 mg/dL  Glucose, capillary     Status: Abnormal   Collection Time: 06/14/15 11:15 AM  Result Value Ref Range   Glucose-Capillary 138 (H) 65 - 99 mg/dL   No components found for: ESR, C REACTIVE PROTEIN MICRO: Recent Results (from the past 720 hour(s))  Culture, blood (routine x 2)     Status: None   Collection Time: 05/31/15  9:20 PM  Result Value Ref Range Status   Specimen Description BLOOD LEFT ASSIST CONTROL  Final   Special Requests BOTTLES DRAWN  AEROBIC AND ANAEROBIC 4CC  Final   Culture  Setup Time   Final    GRAM POSITIVE RODS AEROBIC BOTTLE ONLY CRITICAL RESULT CALLED TO, READ BACK BY AND VERIFIED WITH: MARCEL TURNER AT 2353 ON 06/01/15 RWW CONFIRMED BY [facility]    Culture   Final    GRAM POSITIVE RODS AEROBIC BOTTLE ONLY Results consistent with contamination.    Report Status 06/05/2015 FINAL  Final  Culture, blood (routine x 2)     Status: None   Collection Time: 05/31/15  9:20 PM  Result Value Ref Range Status   Specimen Description BLOOD RIGHT ASSIST CONTROL  Final   Special Requests BOTTLES DRAWN AEROBIC AND ANAEROBIC 4CC  Final   Culture NO GROWTH 5 DAYS  Final   Report Status 06/05/2015 FINAL  Final  C difficile quick scan w PCR reflex     Status: None   Collection Time: 06/11/15  9:04 AM  Result Value Ref Range Status   C Diff antigen NEGATIVE NEGATIVE Final   C Diff toxin NEGATIVE NEGATIVE Final   C Diff interpretation Negative for C. difficile  Corrected    Comment: CORRECTED ON 11/29 AT 1135: PREVIOUSLY REPORTED AS VALID    IMAGING: Dg Chest 2 View  06/04/2015  CLINICAL DATA:  Congestion, weakness, COPD EXAM: CHEST  2 VIEW COMPARISON:  Portable chest x-ray of 05/30/2014 FINDINGS: There is mild atelectasis or scarring at the lung bases. The lungs are somewhat hyperaerated. No active infiltrate or effusion is seen. There is a poorly defined nodular opacity in the right perihilar region. This could simply represent overlapping vasculature, but a developing nodule cannot be excluded. Recommend either followup chest x-ray or CT of the chest to assess further. Mediastinal and hilar contours are unremarkable. The heart is mildly enlarged and stable. The bones are osteopenic and there are diffuse degenerative changes throughout the thoracic spine. IMPRESSION: 1. Hyperaeration with probable basilar atelectasis or scarring. 2. Poorly defined nodular opacity in the right mid lung -perihilar region possibly due to overlapping  vasculature, but consider follow-up chest x-ray or CT of the chest to assess further. Electronically Signed   By: Ivar Drape M.D.   On: 06/04/2015 08:08   Ct Head Wo Contrast  05/31/2015  CLINICAL DATA:  Pt arrived by EMS after being  picked up from home. EMS states pt was at [facility] today for stent placement and was not "happy" with the medicines he was receiving and signed out AMA. EMS stated that upon arrival home pt got out of the.*comment was truncated* EXAM: CT HEAD WITHOUT CONTRAST TECHNIQUE: Contiguous axial images were obtained from the base of the skull through the vertex without intravenous contrast. COMPARISON:  None FINDINGS: Atherosclerotic and physiologic intracranial calcifications. Mild atrophy. There is no evidence of acute intracranial hemorrhage, brain edema, mass lesion, acute infarction, mass effect, or midline shift. Acute infarct may be inapparent on noncontrast CT. No other intra-axial abnormalities are seen, and the ventricles and sulci are within normal limits in size and symmetry. No abnormal extra-axial fluid collections or masses are identified. No significant calvarial abnormality. IMPRESSION: 1. Negative for bleed or other acute intracranial process. Electronically Signed   By: Lucrezia Europe M.D.   On: 05/31/2015 19:30   Ct Angio Chest Pe W/cm &/or Wo Cm  06/10/2015  CLINICAL DATA:  Recent treatment for pneumonia.  Hypoxia.  Dyspnea. EXAM: CT ANGIOGRAPHY CHEST WITH CONTRAST TECHNIQUE: Multidetector CT imaging of the chest was performed using the standard protocol during bolus administration of intravenous contrast. Multiplanar CT image reconstructions and MIPs were obtained to evaluate the vascular anatomy. CONTRAST:  72m OMNIPAQUE IOHEXOL 350 MG/ML SOLN COMPARISON:  Radiographs 06/10/2015 FINDINGS: Cardiovascular: There is good opacification of the pulmonary arteries. There is no pulmonary embolism. The thoracic aorta is normal in caliber and intact. Lungs: There are patchy  airspace opacities in the lower lobes bilaterally, and to a lesser extent in the posterior right upper lobe and in the posterior lingula and right middle lobe. This may represent multifocal pneumonia. This is superimposed on severe emphysematous changes. Central airways: Patent Effusions: None Lymphadenopathy: Nonspecific hilar adenopathy, right greater than left, possibly reactive. Esophagus: Unremarkable Upper abdomen: Small hiatal hernia. Musculoskeletal: No significant abnormality Review of the MIP images confirms the above findings. IMPRESSION: 1. Negative for acute pulmonary embolism. 2. Multifocal patchy airspace opacities bilaterally, consistent with pneumonia. Severe emphysematous changes. Electronically Signed   By: DAndreas NewportM.D.   On: 06/10/2015 21:26   [place]ous Img Lower Unilateral Right  06/04/2015  CLINICAL DATA:  Right lower extremity edema for the past week. History of prior DVT. Evaluate for acute or chronic DVT. EXAM: RIGHT LOWER EXTREMITY VENOUS DOPPLER ULTRASOUND TECHNIQUE: Gray-scale sonography with graded compression, as well as color Doppler and duplex ultrasound were performed to evaluate the lower extremity deep venous systems from the level of the common femoral vein and including the common femoral, femoral, profunda femoral, popliteal and calf veins including the posterior tibial, peroneal and gastrocnemius veins when visible. The superficial great saphenous vein was also interrogated. Spectral Doppler was utilized to evaluate flow at rest and with distal augmentation maneuvers in the common femoral, femoral and popliteal veins. COMPARISON:  None. FINDINGS: Contralateral Common Femoral Vein: Respiratory phasicity is normal and symmetric with the symptomatic side. No evidence of thrombus. Normal compressibility. Common Femoral Vein: There is mixed echogenic nonocclusive thrombus within common femoral vein (images 12 and 21). Saphenofemoral Junction: No evidence of thrombus.  Normal compressibility and flow on color Doppler imaging. Profunda Femoral Vein: No evidence of thrombus. Normal compressibility and flow on color Doppler imaging. Femoral Vein: No evidence of thrombus. Normal compressibility, respiratory phasicity and response to augmentation. Popliteal Vein: No evidence of thrombus. Normal compressibility, respiratory phasicity and response to augmentation. Calf Veins: There is hypoechoic expansile occlusive thrombus within the  right posterior tibial (image 19 and 41) and peroneal veins (image 42). Superficial Great Saphenous Vein: No evidence of thrombus. Normal compressibility and flow on color Doppler imaging. Venous Reflux:  None. Other Findings: Within the superficial tissues about the popliteal fossa is a serpiginous anechoic approximately 2.1 x 0.8 x 3.1 cm fluid collection (representative images 2 and 3). IMPRESSION: 1. The examination is positive for age indeterminate nonocclusive thrombus within the right common femoral vein as well as age-indeterminate occlusive thrombus within the right posterior tibial and peroneal veins. 2. Approximately 3.1 cm anechoic fluid collection within the superficial soft tissues about the right popliteal fossa, nonspecific though likely representative of a leaking Baker's cyst. Clinical correlation is advised. These results will be called to the ordering clinician or representative by the Radiologist Assistant, and communication documented in the PACS or zVision Dashboard. Electronically Signed   By: Sandi Mariscal M.D.   On: 06/04/2015 12:06   Dg Chest Portable 1 View  06/10/2015  CLINICAL DATA:  69 year old male with hypoxia and shortness of breath. Recent pneumonia. EXAM: PORTABLE CHEST 1 VIEW COMPARISON:  06/04/2015 and 05/31/2015 radiographs FINDINGS: Upper limits normal heart size again noted. Pulmonary vascular congestion is present. Bibasilar opacities are stable to minimally increased from the prior study. There is no evidence of  pneumothorax or large pleural effusions. IMPRESSION: Stable to minimally increased bibasilar opacities which may represent atelectasis and/ or airspace disease/ pneumonia. Upper limits normal heart size with mild pulmonary vascular congestion. Electronically Signed   By: Margarette Canada M.D.   On: 06/10/2015 20:10   Dg Chest Port 1 View  05/31/2015  CLINICAL DATA:  Patient history of bronchitis and pneumonia. Syncopal episode. EXAM: PORTABLE CHEST 1 VIEW COMPARISON:  None. FINDINGS: Limited portable chest radiograph. Enlarged cardiac and mediastinal contours. Heterogeneous opacities within the lower lungs bilaterally. Linear opacity right mid lung. No large pleural effusion or pneumothorax. IMPRESSION: Limited exam due to portable technique. Heterogeneous opacities within the lung bases bilaterally favored to represent atelectasis. Linear opacity within the right midlung is nonspecific and represent atelectasis or scarring. Recommend comparison with outside prior exams or repeat PA and lateral chest radiograph for further evaluation. Electronically Signed   By: Lovey Newcomer M.D.   On: 05/31/2015 21:30    Assessment:   Thomas [name] is a 69 y.o. male with advanced COPD admitted 11/28 with progressive SOB and cough. He had been admitted 11/18-11/22 here with fall, confusion and possible HCAP. Treated with vanco zosyn then changed to levo at dc.  Also had prolonged admission 10/30-11/18 at [facility] with RSV + infection and COPD exacerbation and need for mechanical ventilation. He also had DVT and Small SDH dxed at that time.  This admission he tells me he thinks was mainly for his SOb since he was at home and had no O2 and his sats were 87% and that made him weaker.  He has no clinical sxs of PNA with no fevers, NS or productive cough.  Recommendations Discussed with patient that he does not appear to have an active PNA at this point. CT findings likely chronic and subacute from his recent admission.  Advise no need  for abx at this time. He wishes to go home on O2 and I have discussed these recs with the patient and with Dr Manuella Ghazi Monitor for increased sputum production or fevers - if present would check sputum cx to guide therapy Thank you very much for allowing me to participate in the care of this patient.  Please call with questions.   Cheral Marker. Ola Spurr, MD

## 2015-06-14 NOTE — Progress Notes (Signed)
ANTIBIOTIC CONSULT NOTE - Follow up  Pharmacy Consult for Zosyn dosing Indication: HCAP  No Known Allergies  Patient Measurements: Height: 6\' 1"  (185.4 cm) Weight: 255 lb (115.667 kg) IBW/kg (Calculated) : 79.9 Adjusted Body Weight: 94 kg  Vital Signs: Temp: 97.6 F (36.4 C) (12/02 0730) Temp Source: Oral (12/02 0730) BP: 118/92 mmHg (12/02 0730) Pulse Rate: 64 (12/02 0730) Intake/Output from previous day: 12/01 0701 - 12/02 0700 In: 480 [P.O.:480] Out: 400 [Urine:400] Intake/Output from this shift:    Labs:  Recent Labs  06/12/15 1745 06/13/15 0544 06/14/15 0432  WBC  --  20.9* 20.7*  HGB  --  8.7* 8.8*  PLT  --  267 263  CREATININE 1.78* 1.76* 2.11*   Estimated Creatinine Clearance: 44.6 mL/min (by C-G formula based on Cr of 2.11).  Recent Labs  06/13/15 0544 06/13/15 1658  VANCOTROUGH 29* 20     Microbiology: Recent Results (from the past 720 hour(s))  Culture, blood (routine x 2)     Status: None   Collection Time: 05/31/15  9:20 PM  Result Value Ref Range Status   Specimen Description BLOOD LEFT ASSIST CONTROL  Final   Special Requests BOTTLES DRAWN AEROBIC AND ANAEROBIC 4CC  Final   Culture  Setup Time   Final    GRAM POSITIVE RODS AEROBIC BOTTLE ONLY CRITICAL RESULT CALLED TO, READ BACK BY AND VERIFIED WITH: MARCEL TURNER AT 2353 ON 06/01/15 RWW CONFIRMED BY [facility]    Culture   Final    GRAM POSITIVE RODS AEROBIC BOTTLE ONLY Results consistent with contamination.    Report Status 06/05/2015 FINAL  Final  Culture, blood (routine x 2)     Status: None   Collection Time: 05/31/15  9:20 PM  Result Value Ref Range Status   Specimen Description BLOOD RIGHT ASSIST CONTROL  Final   Special Requests BOTTLES DRAWN AEROBIC AND ANAEROBIC 4CC  Final   Culture NO GROWTH 5 DAYS  Final   Report Status 06/05/2015 FINAL  Final  C difficile quick scan w PCR reflex     Status: None   Collection Time: 06/11/15  9:04 AM  Result Value Ref Range Status   C Diff  antigen NEGATIVE NEGATIVE Final   C Diff toxin NEGATIVE NEGATIVE Final   C Diff interpretation Negative for C. difficile  Corrected    Comment: CORRECTED ON 11/29 AT 1135: PREVIOUSLY REPORTED AS VALID    Medical History: Past Medical History  Diagnosis Date  . COPD (chronic obstructive pulmonary disease) (HCC)   . Diabetes mellitus without complication (HCC)   . Hypertension   . DVT (deep venous thrombosis) (HCC)   . Subdural hematoma (HCC)   . Paroxysmal atrial fibrillation (HCC)   . Asthma     Medications:   Assessment: 69 yo male readmitted for HCAP. Patient has recently been switched from Zosyn and Vancomycin to Zosyn and Zyvox due to thought of possible kidney impairment  Goal of Therapy:  Vancomycin trough level 15-20 mcg/ml  Plan:  Will continue Zosyn 4.5 g IV q8 hours. Patient has Pseudomonas risk factors due to being previously being admitted as an inpatient.    Addis,Jearld Hemp D 06/14/2015,11:31 AM

## 2015-06-14 NOTE — Progress Notes (Signed)
[facility] - [place] at [facility]   PATIENT NAME: Thomas [name]    MR#:  [id number]  DATE OF BIRTH:  1946-06-14  SUBJECTIVE:  CHIEF COMPLAINT:   Chief Complaint  Patient presents with  . Shortness of Breath  he refused BIPAP last night. He also refused IV vanco + zosyn per nurses note last night as apparently he felt his kidneys were getting worse. Sitting in chair, denies any new s/s. Creat is 2.11 (1.76) this am REVIEW OF SYSTEMS:  Review of Systems  Constitutional: Negative for fever, weight loss, malaise/fatigue and diaphoresis.  HENT: Negative for ear discharge, ear pain, hearing loss, nosebleeds, sore throat and tinnitus.   Eyes: Negative for blurred vision and pain.  Respiratory: Positive for cough and shortness of breath. Negative for hemoptysis and wheezing.   Cardiovascular: Negative for chest pain, palpitations, orthopnea and leg swelling.  Gastrointestinal: Negative for heartburn, nausea, vomiting, abdominal pain, diarrhea, constipation and blood in stool.  Genitourinary: Negative for dysuria, urgency and frequency.  Musculoskeletal: Negative for myalgias and back pain.  Skin: Negative for itching and rash.  Neurological: Negative for dizziness, tingling, tremors, focal weakness, seizures, weakness and headaches.  Psychiatric/Behavioral: Negative for depression. The patient is not nervous/anxious.    DRUG ALLERGIES:  No Known Allergies VITALS:  Blood pressure 118/92, pulse 64, temperature 97.6 F (36.4 C), temperature source Oral, resp. rate 19, height  (1.854 m), weight 115.667 kg (255 lb), SpO2 95 %. PHYSICAL EXAMINATION:  Physical Exam  Constitutional: He is oriented to person, place, and time and well-developed, well-nourished, and in no distress.  HENT:  Head: Normocephalic and atraumatic.  Eyes: Conjunctivae and EOM are normal. Pupils are equal, round, and reactive to light.  Neck: Normal range of motion. Neck supple. No tracheal  deviation present. No thyromegaly present.  Cardiovascular: Normal rate, regular rhythm and normal heart sounds.   Pulmonary/Chest: Effort normal and breath sounds normal. No respiratory distress. He has no wheezes. He exhibits no tenderness.  Abdominal: Soft. Bowel sounds are normal. He exhibits no distension. There is no tenderness.  Musculoskeletal: Normal range of motion.  Neurological: He is alert and oriented to person, place, and time. No cranial nerve deficit.  Skin: Skin is warm and dry. No rash noted.  Psychiatric: Mood and affect normal.   LABORATORY PANEL:   CBC  Recent Labs Lab 06/14/15 0432  WBC 20.7*  HGB 8.8*  HCT 28.2*  PLT 263   ------------------------------------------------------------------------------------------------------------------ Chemistries   Recent Labs Lab 06/14/15 0432  NA 134*  K 4.0  CL 94*  CO2 28  GLUCOSE 144*  BUN 41*  CREATININE 2.11*  CALCIUM 8.1*  AST 29  ALT 22  ALKPHOS 58  BILITOT 0.5   RADIOLOGY:  No results found. ASSESSMENT AND PLAN:  69 y.o. male who presented with persistent and progressive shortness of breath, as well as cough and wheezing admitted for  * Healthcare-associated pneumonia - changed to Zyvox and zosyn last evening with worsening kidney function vanco stopped. ID c/s. WBC 20.7  * ARF: may have underlying CKD. nephro c/s, avoid nephrotoxins, vanco stopped.   * COPD exacerbation (chronic obstructive pulmonary disease)  - continue IV steroids and DuoNeb's plus antibiotics as above. Appreciate Pulmo input. Continue with current antibiotics of zyvox and Zosyn -ID c/s - BiPAP at night and as need - pt refused last night -Maintain O2 saturations are 88%  -Review of the CT shows chronic emphysematous changes along with lower lobe scarring, may be  some component of aspiration - overall poor psycho-social situations making him high risk for recurrent admissions.  * HTN (hypertension) - continue coreg,  cardizem   * Type 2 diabetes mellitus (HCC) - not on any anti-glycemic medications at home. Hemoglobin A1c 6.1. monitor  * Paroxysmal a-fib (HCC) - continue home rate controlling medication as well as anticoagulation. On Eliquis and cardizem, coreg.   [facility] more than 48-72 hrs. Needs to be transferred there if beds available as he will need ongoing inpt stay   All the records are reviewed and case discussed with Care Management/Social Worker. Management plans discussed with the patient, family and they are in agreement.  CODE STATUS: Full Code  TOTAL TIME TAKING CARE OF THIS PATIENT: 35 minutes.   More than 50% of the time was spent in counseling/coordination of care: YES (d/w family at bedside)  POSSIBLE D/C IN 1-2 DAYS, DEPENDING ON CLINICAL CONDITION. [facility].   [facility], Marchia Diguglielmo M.D on 06/14/2015 at 12:55 PM  Between 7am to 6pm - Pager - [phone]  After 6pm go to [email or web address] - password [email or web address] [facility]  [facility]  Office  [phone]  CC: Primary care physician; [facility]  Note: This dictation was prepared with Dragon dictation along with smaller phrase technology. Any transcriptional errors that result from this process are unintentional.

## 2015-06-14 NOTE — Clinical Social Work Note (Signed)
CSW udpated [facility] Transfer is still pending. Will call when transfer is confirmed. No plan for discharge at time of note. CSW will continue to follow.   Dede QuerySarah Lillard Bailon, MSW, LCSW Clinical Social Worker  [phone]

## 2015-06-14 NOTE — Consult Note (Signed)
[facility] CONSULT NOTE    Date: 06/14/2015                  Patient Name:  Thomas [name]  MRN: [id number]  DOB: [birth year]  Age / Sex: 69 y.o., male         PCP: No PCP Per Patient                 Service Requesting Consult: Dr. Max Sane                 Reason for Consult: Acute renal failure            History of Present Illness: Patient is a 69 y.o. male with a PMHx of COPD, diabetes mellitus, hypertension, history of DVT, subdural hematoma, paroxysmal atrial for ablation, asthma, who was admitted to [facility] on 06/10/2015 for evaluation of shortness of breath. The patient had recent admission here for healthcare associated pneumonia for which she was treated.  He completed a course of antibiotics at home but then redeveloped shortness of breath.  He subsequently came to [facility] emergency department for evaluation.  He was found to have multifocal opacities on chest x-ray and CT scan.we are asked to evaluate him for acute renal failure.  The patient's baseline creatinine appears to be 1.19 from 06/05/15 with an EGFR greater than 60.  However creatinine has now risen to 2.11.  He patient had CT angiogram performed on 06/10/15.  Therefore he had exposure to contrast.  Otherwise he does not appear to be on any obvious nephrotoxins at this time.   Medications: Outpatient medications: Prescriptions prior to admission  Medication Sig Dispense Refill Last Dose  . apixaban (ELIQUIS) 5 MG TABS tablet Take 2 tablets (10 mg total) by mouth 2 (two) times daily. 14 tablet 0   . apixaban (ELIQUIS) 5 MG TABS tablet Take 1 tablet (5 mg total) by mouth 2 (two) times daily. 60 tablet 3   . carvedilol (COREG) 6.25 MG tablet Take 6.25 mg by mouth 2 (two) times daily.   unknown at unknown  . cholecalciferol 2000 UNITS TABS Take 1 tablet (2,000 Units total) by mouth daily. 30 tablet 0   . diltiazem (CARDIZEM CD) 360 MG 24 hr capsule Take 360 mg by mouth  daily.   unknown at unknown  . gabapentin (NEURONTIN) 300 MG capsule Take 300 mg by mouth 3 (three) times daily.   unknown at unknown  . Melatonin 3 MG TABS Take 3 mg by mouth at bedtime as needed (for sleep).   PRN at PRN  . Multiple Vitamin (MULTIVITAMIN WITH MINERALS) TABS tablet Take 1 tablet by mouth daily.   unknown at unknown  . niacin 100 MG tablet Take 300 mg by mouth 3 (three) times daily with meals.   unknown at unknown  . omega-3 acid ethyl esters (LOVAZA) 1 G capsule Take 2 g by mouth 2 (two) times daily.   unknown at unknown  . potassium chloride SA (K-DUR,KLOR-CON) 20 MEQ tablet Take 20 mEq by mouth daily.   unknown at unknown  . pravastatin (PRAVACHOL) 40 MG tablet Take 40 mg by mouth at bedtime.   unknown at unknown  . Pyridoxine HCl (VITAMIN B-6) 500 MG tablet Take 1,000 mg by mouth daily.   unknown at unknown  . thiamine (VITAMIN B-1) 100 MG tablet Take 100 mg by mouth daily.   unknown at unknown  . vitamin B-12 (CYANOCOBALAMIN) 1000 MCG tablet Take  1,000 mcg by mouth daily.   unknown at unknown  . Vitamin D-Vitamin K (VITAMIN K2-VITAMIN D3) 45-2000 MCG-UNIT CAPS Take 1 capsule by mouth daily.   unknown at unknown    Current medications: Current Facility-Administered Medications  Medication Dose Route Frequency Provider Last Rate Last Dose  . acetaminophen (TYLENOL) tablet 650 mg  650 mg Oral Q6H PRN Lance Coon, MD   650 mg at 06/14/15 0530   Or  . acetaminophen (TYLENOL) suppository 650 mg  650 mg Rectal Q6H PRN Lance Coon, MD      . apixaban Arne Cleveland) tablet 5 mg  5 mg Oral BID Lance Coon, MD   5 mg at 06/14/15 1145  . benzonatate (TESSALON) capsule 200 mg  200 mg Oral TID PRN Lance Coon, MD      . carvedilol (COREG) tablet 6.25 mg  6.25 mg Oral BID Lance Coon, MD   6.25 mg at 06/14/15 1157  . diltiazem (CARDIZEM CD) 24 hr capsule 360 mg  360 mg Oral Daily Lance Coon, MD   360 mg at 06/14/15 1157  . fluticasone (FLONASE) 50 MCG/ACT nasal spray 2 spray  2 spray  Each Nare Daily Max Sane, MD   2 spray at 06/14/15 1000  . gabapentin (NEURONTIN) capsule 300 mg  300 mg Oral TID Max Sane, MD   300 mg at 06/14/15 1547  . ipratropium-albuterol (DUONEB) 0.5-2.5 (3) MG/3ML nebulizer solution 3 mL  3 mL Nebulization Q4H PRN Lance Coon, MD      . linezolid (ZYVOX) IVPB 600 mg  600 mg Intravenous Q12H Theodoro Grist, MD   600 mg at 06/14/15 1145  . methylPREDNISolone sodium succinate (SOLU-MEDROL) 125 mg/2 mL injection 80 mg  80 mg Intravenous Q12H Lance Coon, MD   80 mg at 06/14/15 1548  . ondansetron (ZOFRAN) tablet 4 mg  4 mg Oral Q6H PRN Lance Coon, MD   4 mg at 06/13/15 1632   Or  . ondansetron [facility]) injection 4 mg  4 mg Intravenous Q6H PRN Lance Coon, MD      . piperacillin-tazobactam (ZOSYN) IVPB 4.5 g  4.5 g Intravenous 3 times per day Lance Coon, MD   4.5 g at 06/14/15 1547  . pravastatin (PRAVACHOL) tablet 40 mg  40 mg Oral QHS Lance Coon, MD   40 mg at 06/13/15 2112  . sodium chloride (OCEAN) 0.65 % nasal spray 1 spray  1 spray Each Nare PRN Vipul Shah, MD      . sodium chloride 0.9 % injection 3 mL  3 mL Intravenous Q12H Lance Coon, MD   3 mL at 06/14/15 1157      Allergies: No Known Allergies    Past Medical History: Past Medical History  Diagnosis Date  . COPD (chronic obstructive pulmonary disease) ([place])   . Diabetes mellitus without complication ([place])   . Hypertension   . DVT (deep venous thrombosis) ([place])   . Subdural hematoma ([place])   . Paroxysmal atrial fibrillation (HCC)   . Asthma      Past Surgical History: Past Surgical History  Procedure Laterality Date  . Coronary stent placement    . Ivc filter placement ([facility] hx)       Family History: Family History  Problem Relation Age of Onset  . Hypertension Other   . Stroke Mother   . Heart failure Father   . Cancer Father      Social History: Social History   Social History  . Marital Status: Married  Spouse Name: N/A  . Number of Children: N/A   . Years of Education: N/A   Occupational History  . Not on file.   Social History Main Topics  . Smoking status: Former Research scientist (life sciences)  . Smokeless tobacco: Not on file  . Alcohol Use: No  . Drug Use: No  . Sexual Activity: Not on file   Other Topics Concern  . Not on file   Social History Narrative     Review of Systems: Review of Systems  Constitutional: Positive for malaise/fatigue. Negative for fever, chills and weight loss.  HENT: Positive for congestion. Negative for ear discharge.   Eyes: Negative for blurred vision and double vision.  Respiratory: Positive for cough, sputum production, shortness of breath and wheezing. Negative for hemoptysis.   Cardiovascular: Positive for orthopnea. Negative for chest pain and palpitations.  Gastrointestinal: Negative for heartburn, nausea, vomiting and diarrhea.  Genitourinary: Negative for dysuria, urgency and frequency.  Musculoskeletal: Negative for myalgias and neck pain.  Skin: Negative for itching and rash.  Neurological: Positive for weakness. Negative for dizziness, focal weakness and headaches.  Endo/Heme/Allergies: Does not bruise/bleed easily.  Psychiatric/Behavioral: Negative for depression. The patient is not nervous/anxious.       Vital Signs: Blood pressure 138/64, pulse 70, temperature 97.3 F (36.3 C), temperature source Oral, resp. rate 20, height _0  (1.854 m), weight 115.667 kg (255 lb), SpO2 99 %.  Weight trends: Filed Weights   06/10/15 1907  Weight: 115.667 kg (255 lb)    Physical Exam: General: NAD, resting in b ed  Head: Normocephalic, atraumatic.  Eyes: Anicteric, EOMI, PEERL  Nose: Mucous membranes moist, not inflammed, nonerythematous.  Throat: Oropharynx nonerythematous, no exudate appreciated.   Neck: Supple trachea midline.  Lungs:  Bilateral rhonchi, normal effort.  Heart: S1S2 no rubs  Abdomen:  BS normoactive. Soft, Nondistended, non-tender.  No masses or organomegaly.  Extremities: 2+  bilateral LE edema.  Neurologic: A&O X3, Motor strength is 5/5 in the all 4 extremities  Skin: No visible rashes, scars.    Lab results: Basic Metabolic Panel:  Recent Labs Lab 06/11/15 0634 06/12/15 1745 06/13/15 0544 06/14/15 0432  NA 139  --  140 134*  K 4.4  --  4.6 4.0  CL 101  --  100* 94*  CO2 30  --  29 28  GLUCOSE 159*  --  161* 144*  BUN 12  --  34* 41*  CREATININE 1.39* 1.78* 1.76* 2.11*  CALCIUM 8.4*  --  8.7* 8.1*    Liver Function Tests:  Recent Labs Lab 06/10/15 1926 06/14/15 0432  AST 30 29  ALT 26 22  ALKPHOS 64 58  BILITOT 0.6 0.5  PROT 7.1 6.6  ALBUMIN 2.7* 2.6*   No results for input(s): LIPASE, AMYLASE in the last 168 hours. No results for input(s): AMMONIA in the last 168 hours.  CBC:  Recent Labs Lab 06/10/15 1926 06/11/15 0634 06/13/15 0544 06/14/15 0432  WBC 9.3 7.4 20.9* 20.7*  NEUTROABS 7.1*  --   --   --   HGB 8.5* 8.7* 8.7* 8.8*  HCT 26.7* 27.3* 27.9* 28.2*  MCV 88.7 88.4 88.8 88.1  PLT 234 222 267 263    Cardiac Enzymes:  Recent Labs Lab 06/10/15 1926  TROPONINI <0.03    BNP: Invalid input(s): POCBNP  CBG:  Recent Labs Lab 06/13/15 1529 06/13/15 2052 06/14/15 0727 06/14/15 1115 06/14/15 1645  GLUCAP 149* 130* 119* 138* 145*    Microbiology: Results for orders placed or  performed during the hospital encounter of 06/10/15  C difficile quick scan w PCR reflex     Status: None   Collection Time: 06/11/15  9:04 AM  Result Value Ref Range Status   C Diff antigen NEGATIVE NEGATIVE Final   C Diff toxin NEGATIVE NEGATIVE Final   C Diff interpretation Negative for C. difficile  Corrected    Comment: CORRECTED ON 11/29 AT 1135: PREVIOUSLY REPORTED AS VALID    Coagulation Studies: No results for input(s): LABPROT, INR in the last 72 hours.  Urinalysis: No results for input(s): COLORURINE, LABSPEC, PHURINE, GLUCOSEU, HGBUR, BILIRUBINUR, KETONESUR, PROTEINUR, UROBILINOGEN, NITRITE, LEUKOCYTESUR in the last  72 hours.  Invalid input(s): APPERANCEUR    Imaging:  No results found.   Assessment & Plan: Pt is a 69 y.o. yo male with a PMHx of COPD, diabetes mellitus, hypertension, history of DVT, subdural hematoma, paroxysmal atrial for ablation, asthma, who was admitted to [facility] on 06/10/2015 for evaluation of shortness of breath.   1.  Acute renal failure.  Secondary to contrast exposure most likely.  On 06/10/15 creatinine was 1.3.  Thereafter patient was exposed to contrast and creatinine began to rise. -we will obtain further workup including renal ultrasound and urine for eosinophils.  No urgent indication for dialysis.  We will start the patient on gentle IV fluid hydration with 0.9 normal saline at 75 cc per hour.  We will need to watch for volume overload however.  2.  Bacterial pneumonia. Patient has multifocal opacities on CT scan of the chest.  Patient currently on Zyvox and Zosyn.  We recommend continuing these.  3.  Anemia unspecified.  We will check serum iron studies as well as SPEP and UPEP.  4.  Thanks for consultation.

## 2015-06-14 NOTE — Progress Notes (Signed)
PT Cancellation Note  Patient Details Name: Thomas [name] MRN: [id number] DOB: Jun 20, 1946   Cancelled Treatment:    Reason Eval/Treat Not Completed: Patient declined, no reason specified (Treatment session attempted. Patient refused despite encouragement from therapist, states "I am sleeping now.  I have been up walking in my room already."  Will re-attempt at later time date as schedule and patient allow.)   Shirlene Andaya H. Manson PasseyBrown, PT, DPT, NCS 06/14/2015, 11:33 AM [phone]

## 2015-06-14 NOTE — Care Management (Signed)
Per patient/daughter patient's home O2 has been arranged and delivered. Per Dr Delfino LovettVipul Shah patient will not be ready for discharge due to renal function studies which I [facility]. Per Dr[facility] Sherryll BurgerShah [facility]. I spoke with Peyton NajjarLarry and he will review case and discuss with [facility] physician.

## 2015-06-15 ENCOUNTER — Inpatient Hospital Stay: Payer: Non-veteran care

## 2015-06-15 DIAGNOSIS — N1339 Other hydronephrosis: Secondary | ICD-10-CM | POA: Insufficient documentation

## 2015-06-15 DIAGNOSIS — N35919 Unspecified urethral stricture, male, unspecified site: Secondary | ICD-10-CM | POA: Insufficient documentation

## 2015-06-15 LAB — CBC
HEMATOCRIT: 28 % — AB (ref 40.0–52.0)
HEMOGLOBIN: 8.8 g/dL — AB (ref 13.0–18.0)
MCH: 27.6 pg (ref 26.0–34.0)
MCHC: 31.5 g/dL — AB (ref 32.0–36.0)
MCV: 87.6 fL (ref 80.0–100.0)
Platelets: 260 10*3/uL (ref 150–440)
RBC: 3.19 MIL/uL — ABNORMAL LOW (ref 4.40–5.90)
RDW: 15.8 % — ABNORMAL HIGH (ref 11.5–14.5)
WBC: 16.2 10*3/uL — ABNORMAL HIGH (ref 3.8–10.6)

## 2015-06-15 LAB — BASIC METABOLIC PANEL
Anion gap: 12 (ref 5–15)
BUN: 48 mg/dL — AB (ref 6–20)
CHLORIDE: 96 mmol/L — AB (ref 101–111)
CO2: 27 mmol/L (ref 22–32)
CREATININE: 2.38 mg/dL — AB (ref 0.61–1.24)
Calcium: 7.9 mg/dL — ABNORMAL LOW (ref 8.9–10.3)
GFR calc non Af Amer: 26 mL/min — ABNORMAL LOW (ref >60)
GFR, EST AFRICAN AMERICAN: 31 mL/min — AB (ref >60)
Glucose, Bld: 153 mg/dL — ABNORMAL HIGH (ref 65–99)
Potassium: 3.9 mmol/L (ref 3.5–5.1)
Sodium: 135 mmol/L (ref 135–145)

## 2015-06-15 LAB — GLUCOSE, CAPILLARY
GLUCOSE-CAPILLARY: 126 mg/dL — AB (ref 65–99)
GLUCOSE-CAPILLARY: 168 mg/dL — AB (ref 65–99)
GLUCOSE-CAPILLARY: 195 mg/dL — AB (ref 65–99)
Glucose-Capillary: 169 mg/dL — ABNORMAL HIGH (ref 65–99)

## 2015-06-15 MED ORDER — GUAIFENESIN ER 600 MG PO TB12
600.0000 mg | ORAL_TABLET | Freq: Two times a day (BID) | ORAL | Status: DC
  Administered 2015-06-15 – 2015-06-16 (×3): 600 mg via ORAL
  Filled 2015-06-15 (×3): qty 1

## 2015-06-15 MED ORDER — SODIUM CHLORIDE 0.9 % IV SOLN
INTRAVENOUS | Status: DC
  Administered 2015-06-15: 15:00:00 via INTRAVENOUS

## 2015-06-15 MED ORDER — GUAIFENESIN-DM 100-10 MG/5ML PO SYRP: 10.0000 mL | ORAL_SOLUTION | Freq: Four times a day (QID) | ORAL | Status: DC | PRN

## 2015-06-15 NOTE — Progress Notes (Signed)
[facility] Physicians - [place] at [facility]   PATIENT NAME: Thomas [name]    MR#:  [id number]  DATE OF BIRTH:  Aug 27, 1945  SUBJECTIVE:  CHIEF COMPLAINT:   Chief Complaint  Patient presents with  . Shortness of Breath  Pt is OOB in chair. Feeling fine , denies any new s/s. Creat is 2.11--> 2.38  (1.76) this am REVIEW OF SYSTEMS:  Review of Systems  Constitutional: Negative for fever, weight loss, malaise/fatigue and diaphoresis.  HENT: Negative for ear discharge, ear pain, hearing loss, nosebleeds, sore throat and tinnitus.   Eyes: Negative for blurred vision and pain.  Respiratory: Positive for cough and shortness of breath. Negative for hemoptysis and wheezing.   Cardiovascular: Negative for chest pain, palpitations, orthopnea and leg swelling.  Gastrointestinal: Negative for heartburn, nausea, vomiting, abdominal pain, diarrhea, constipation and blood in stool.  Genitourinary: Negative for dysuria, urgency and frequency.  Musculoskeletal: Negative for myalgias and back pain.  Skin: Negative for itching and rash.  Neurological: Negative for dizziness, tingling, tremors, focal weakness, seizures, weakness and headaches.  Psychiatric/Behavioral: Negative for depression. The patient is not nervous/anxious.    DRUG ALLERGIES:  No Known Allergies VITALS:  Blood pressure 112/48, pulse 65, temperature 97.7 F (36.5 C), temperature source Oral, resp. rate 18, height  (1.854 m), weight 115.667 kg (255 lb), SpO2 93 %. PHYSICAL EXAMINATION:  Physical Exam  Constitutional: He is oriented to person, place, and time and well-developed, well-nourished, and in no distress.  HENT:  Head: Normocephalic and atraumatic.  Eyes: Conjunctivae and EOM are normal. Pupils are equal, round, and reactive to light.  Neck: Normal range of motion. Neck supple. No tracheal deviation present. No thyromegaly present.  Cardiovascular: Normal rate, regular rhythm and normal heart sounds.    Pulmonary/Chest: Effort normal and breath sounds normal. No respiratory distress. He has no wheezes. He exhibits no tenderness.  Abdominal: Soft. Bowel sounds are normal. He exhibits no distension. There is no tenderness.  Musculoskeletal: Normal range of motion.  Neurological: He is alert and oriented to person, place, and time. No cranial nerve deficit.  Skin: Skin is warm and dry. No rash noted.  Psychiatric: Mood and affect normal.   LABORATORY PANEL:   CBC  Recent Labs Lab 06/15/15 0424  WBC 16.2*  HGB 8.8*  HCT 28.0*  PLT 260   ------------------------------------------------------------------------------------------------------------------ Chemistries   Recent Labs Lab 06/14/15 0432 06/15/15 0424  NA 134* 135  K 4.0 3.9  CL 94* 96*  CO2 28 27  GLUCOSE 144* 153*  BUN 41* 48*  CREATININE 2.11* 2.38*  CALCIUM 8.1* 7.9*  AST 29  --   ALT 22  --   ALKPHOS 58  --   BILITOT 0.5  --    RADIOLOGY:  US Renal  06/15/2015  CLINICAL DATA:  Acute renal failure EXAM: RENAL / URINARY TRACT ULTRASOUND COMPLETE COMPARISON:  None. FINDINGS: Right Kidney: Length: 13 cm. Mild fullness of the intrarenal collecting system. An echogenic focus that was measured is not convincing for stone given lack of shadowing. No cortical thinning or evidence of mass. Left Kidney: Length: 13 cm. Echogenicity within normal limits. No mass or hydronephrosis visualized. Measured echogenic structure not convincing for stone given lack of shadowing. Bladder: There is a 15 mm flat, partially hypoechoic mass along the right lower bladder wall. No internal stone is seen. IMPRESSION: 1. No bilateral hydronephrosis to explain acute renal failure. 2. Mild right hydronephrosis with a 15 mm bladder mass near the right  ureteral orifice. This could reflect a transitional cell carcinoma or edema at the ureteral orifice and renal protocol CT or cystoscopy recommended. Electronically Signed   By: Marnee SpringJonathon  Watts M.D.   On:  06/15/2015 09:22   ASSESSMENT AND PLAN:  69 y.o. male who presented with persistent and progressive shortness of breath, as well as cough and wheezing admitted for  * Healthcare-associated pneumonia - prob chronic changes in CT and unlikely active pna, ID has recommended to d/c Zyvox and zosyn Appreciate ID recs  * ARF: may have underlying CKD.  nephro recs IVF   avoid nephrotoxinS Rpt am labs  * Hydronephrosis and bladder lesion [facility]rology [facility] fx improves  * COPD exacerbation (chronic obstructive pulmonary disease)  - continue IV steroids and DuoNeb's plus antibiotics as above. Appreciate Pulmo input. - BiPAP at night and as need  -Maintain O2 saturations are 88%  -Review of the CT shows chronic emphysematous changes along with lower lobe scarring, may be some component of aspiration - overall poor psycho-social situations making him high risk for recurrent admissions.  * HTN (hypertension) - continue coreg, cardizem   * Type 2 diabetes mellitus (HCC) - not on any anti-glycemic medications at home. Hemoglobin A1c 6.1. monitor  * Paroxysmal a-fib (HCC) - continue home rate controlling medication as well as anticoagulation. On Eliquis and cardizem, coreg.   Still [facility] for [facility] transfer for more than 48-72 hrs. Needs to be transferred there if beds available as he will need ongoing inpt stay   All the records are reviewed and case discussed with Care Management/Social Worker. Management plans discussed with the patient, family and they are in agreement.  CODE STATUS: Full Code  TOTAL TIME TAKING CARE OF THIS PATIENT: 35 minutes.   More than 50% of the time was spent in counseling/coordination of care: YES (d/w family at bedside)  POSSIBLE D/C IN 1-2 DAYS, DEPENDING ON CLINICAL CONDITION. [facility].   Ramonita LabGouru, Dajon Lazar M.D on 06/15/2015 at 10:45 PM  Between 7am to 6pm - Pager -  [phone]  After 6pm go to [email or web address] - password [email or web address] [facility]  [facility]  Office  [phone]  CC: Primary care physician; [facility]  Note: This dictation was prepared with Dragon dictation along with smaller phrase technology. Any transcriptional errors that result from this process are unintentional.

## 2015-06-15 NOTE — Consult Note (Addendum)
UROLOGY CONSULT NOTE   Chief Complaint: Possible Bladder Mass on Ultrasound, right hydronephrosis.    HPI: 69 y.o. male with a history of advanced COPD a who was admitted on 11/28 with progressive SOB and cough. His history is significant for a recent hospitalization at [facility] requiring mechanical ventilation, DVT, and subdural hematoma.  Additionally, DM, HTN, Afib, and IVC placement, and CAD.  In the current evaluation of his SOB, he underwent a CT PE protocol which was negative for PE was demonstrated findings consisted with pneumonia. He has undergone treatment with IV antibiotics but his currently off.  His renal function decreased with a rise in creatinine from baseline of 1.3 at admission to now 2.38.  Ultrasound performed as a result which suggested the presence of a bladder mass near the UO on the right side. Urology is consulted for evaluation.   The patient states he was a former smoker with a 35 pack-year history. He states his urine flow is normal. He has no nocturia, no dysuria, no frequency, urgency or incontinence. He denies flank pain.  No history of kidney stones. He has never seen blood in his urine.   He has undergone a cystoscopy and dilation for a urethral stricture, cystolitholapaxy at [facility] in 2011. Since then he has not required any additional intervention. Notably at that time, he had a flat appearing stone on the floor of the bladder.  The stone was completely removed.    Past Medical History  Diagnosis Date  . COPD (chronic obstructive pulmonary disease) ([place])   . Diabetes mellitus without complication ([place])   . Hypertension   . DVT (deep venous thrombosis) ([place])   . Subdural hematoma ([place])   . Paroxysmal atrial fibrillation ([facility])   . Asthma     Past Surgical History  Procedure Laterality Date  . Coronary stent placement    . Ivc filter placement ([facility] hx)      Family History  Problem Relation Age of Onset  . Hypertension Other   . Stroke Mother   . Heart  failure Father   . Cancer Father    Social History:  reports that he has quit smoking. He does not have any smokeless tobacco history on file. He reports that he does not drink alcohol or use illicit drugs.  Allergies: No Known Allergies  Medications Prior to Admission  Medication Sig Dispense Refill  . apixaban (ELIQUIS) 5 MG TABS tablet Take 2 tablets (10 mg total) by mouth 2 (two) times daily. 14 tablet 0  . apixaban (ELIQUIS) 5 MG TABS tablet Take 1 tablet (5 mg total) by mouth 2 (two) times daily. 60 tablet 3  . carvedilol (COREG) 6.25 MG tablet Take 6.25 mg by mouth 2 (two) times daily.    . cholecalciferol 2000 UNITS TABS Take 1 tablet (2,000 Units total) by mouth daily. 30 tablet 0  . diltiazem (CARDIZEM CD) 360 MG 24 hr capsule Take 360 mg by mouth daily.    Marland Kitchen gabapentin (NEURONTIN) 300 MG capsule Take 300 mg by mouth 3 (three) times daily.    . Melatonin 3 MG TABS Take 3 mg by mouth at bedtime as needed (for sleep).    . Multiple Vitamin (MULTIVITAMIN WITH MINERALS) TABS tablet Take 1 tablet by mouth daily.    . niacin 100 MG tablet Take 300 mg by mouth 3 (three) times daily with meals.    Marland Kitchen omega-3 acid ethyl esters (LOVAZA) 1 G capsule Take 2 g by mouth 2 (two) times daily.    Marland Kitchen  potassium chloride SA (K-DUR,KLOR-CON) 20 MEQ tablet Take 20 mEq by mouth daily.    . pravastatin (PRAVACHOL) 40 MG tablet Take 40 mg by mouth at bedtime.    . Pyridoxine HCl (VITAMIN B-6) 500 MG tablet Take 1,000 mg by mouth daily.    Marland Kitchen thiamine (VITAMIN B-1) 100 MG tablet Take 100 mg by mouth daily.    . vitamin B-12 (CYANOCOBALAMIN) 1000 MCG tablet Take 1,000 mcg by mouth daily.    . Vitamin D-Vitamin K (VITAMIN K2-VITAMIN D3) 45-2000 MCG-UNIT CAPS Take 1 capsule by mouth daily.      Results for orders placed or performed during the hospital encounter of 06/10/15 (from the past 48 hour(s))  Vancomycin, trough     Status: None   Collection Time: 06/13/15  4:58 PM  Result Value Ref Range    Vancomycin Tr 20 10 - 20 ug/mL  Glucose, capillary     Status: Abnormal   Collection Time: 06/13/15  8:52 PM  Result Value Ref Range   Glucose-Capillary 130 (H) 65 - 99 mg/dL  CBC     Status: Abnormal   Collection Time: 06/14/15  4:32 AM  Result Value Ref Range   WBC 20.7 (H) 3.8 - 10.6 K/uL   RBC 3.20 (L) 4.40 - 5.90 MIL/uL   Hemoglobin 8.8 (L) 13.0 - 18.0 g/dL   HCT 28.2 (L) 40.0 - 52.0 %   MCV 88.1 80.0 - 100.0 fL   MCH 27.4 26.0 - 34.0 pg   MCHC 31.1 (L) 32.0 - 36.0 g/dL   RDW 16.0 (H) 11.5 - 14.5 %   Platelets 263 150 - 440 K/uL  Comprehensive metabolic panel     Status: Abnormal   Collection Time: 06/14/15  4:32 AM  Result Value Ref Range   Sodium 134 (L) 135 - 145 mmol/L   Potassium 4.0 3.5 - 5.1 mmol/L   Chloride 94 (L) 101 - 111 mmol/L   CO2 28 22 - 32 mmol/L   Glucose, Bld 144 (H) 65 - 99 mg/dL   BUN 41 (H) 6 - 20 mg/dL   Creatinine, Ser 2.11 (H) 0.61 - 1.24 mg/dL   Calcium 8.1 (L) 8.9 - 10.3 mg/dL   Total Protein 6.6 6.5 - 8.1 g/dL   Albumin 2.6 (L) 3.5 - 5.0 g/dL   AST 29 15 - 41 U/L   ALT 22 17 - 63 U/L   Alkaline Phosphatase 58 38 - 126 U/L   Total Bilirubin 0.5 0.3 - 1.2 mg/dL   GFR calc non Af Amer 31 (L) >60 mL/min   GFR calc Af Amer 35 (L) >60 mL/min    Comment: (NOTE) The eGFR has been calculated using the CKD EPI equation. This calculation has not been validated in all clinical situations. eGFR's persistently <60 mL/min signify possible Chronic Kidney Disease.    Anion gap 12 5 - 15  Glucose, capillary     Status: Abnormal   Collection Time: 06/14/15  7:27 AM  Result Value Ref Range   Glucose-Capillary 119 (H) 65 - 99 mg/dL  Glucose, capillary     Status: Abnormal   Collection Time: 06/14/15 11:15 AM  Result Value Ref Range   Glucose-Capillary 138 (H) 65 - 99 mg/dL  Glucose, capillary     Status: Abnormal   Collection Time: 06/14/15  4:45 PM  Result Value Ref Range   Glucose-Capillary 145 (H) 65 - 99 mg/dL  Protein / creatinine ratio, urine      Status: None   Collection  Time: 06/14/15  7:28 PM  Result Value Ref Range   Creatinine, Urine 52 mg/dL   Total Protein, Urine 6 mg/dL    Comment: NO NORMAL RANGE ESTABLISHED FOR THIS TEST   Protein Creatinine Ratio 0.12 0.00 - 0.15 mg/mg[Cre]  Glucose, capillary     Status: Abnormal   Collection Time: 06/14/15  9:06 PM  Result Value Ref Range   Glucose-Capillary 152 (H) 65 - 99 mg/dL  CBC     Status: Abnormal   Collection Time: 06/15/15  4:24 AM  Result Value Ref Range   WBC 16.2 (H) 3.8 - 10.6 K/uL   RBC 3.19 (L) 4.40 - 5.90 MIL/uL   Hemoglobin 8.8 (L) 13.0 - 18.0 g/dL   HCT 28.0 (L) 40.0 - 52.0 %   MCV 87.6 80.0 - 100.0 fL   MCH 27.6 26.0 - 34.0 pg   MCHC 31.5 (L) 32.0 - 36.0 g/dL   RDW 15.8 (H) 11.5 - 14.5 %   Platelets 260 150 - 440 K/uL  Basic metabolic panel     Status: Abnormal   Collection Time: 06/15/15  4:24 AM  Result Value Ref Range   Sodium 135 135 - 145 mmol/L   Potassium 3.9 3.5 - 5.1 mmol/L   Chloride 96 (L) 101 - 111 mmol/L   CO2 27 22 - 32 mmol/L   Glucose, Bld 153 (H) 65 - 99 mg/dL   BUN 48 (H) 6 - 20 mg/dL   Creatinine, Ser 2.38 (H) 0.61 - 1.24 mg/dL   Calcium 7.9 (L) 8.9 - 10.3 mg/dL   GFR calc non Af Amer 26 (L) >60 mL/min   GFR calc Af Amer 31 (L) >60 mL/min    Comment: (NOTE) The eGFR has been calculated using the CKD EPI equation. This calculation has not been validated in all clinical situations. eGFR's persistently <60 mL/min signify possible Chronic Kidney Disease.    Anion gap 12 5 - 15  Glucose, capillary     Status: Abnormal   Collection Time: 06/15/15  9:02 AM  Result Value Ref Range   Glucose-Capillary 126 (H) 65 - 99 mg/dL   Comment 1 Notify RN   Glucose, capillary     Status: Abnormal   Collection Time: 06/15/15 11:02 AM  Result Value Ref Range   Glucose-Capillary 168 (H) 65 - 99 mg/dL   Comment 1 Notify RN     Review of Systems  Constitutional: Negative for fever and chills.  Eyes: Negative for blurred vision.   Respiratory: Positive for cough. Negative for hemoptysis and shortness of breath.   Cardiovascular: Negative for chest pain.  Gastrointestinal: Negative for nausea, vomiting, abdominal pain, diarrhea and constipation.  Genitourinary: Negative for dysuria, urgency, frequency, hematuria and flank pain.  Musculoskeletal: Negative for myalgias.  Skin: Negative for rash.  Neurological: Negative for dizziness, tremors and headaches.  Endo/Heme/Allergies: Does not bruise/bleed easily.  Psychiatric/Behavioral: The patient does not have insomnia.   All other systems reviewed and are negative.   BP 120/50 mmHg  Pulse 61  Temp(Src) 97.6 F (36.4 C) (Oral)  Resp 17  Ht _0  (1.854 m)  Wt 115.667 kg (255 lb)  BMI 33.65 kg/m2  SpO2 100%   General: well-developed male, in no acute distress HEENT: PERRL, EOMI, OP moist without lesions, nasal cannulate in place Neck: supple, trachea midline; no thyromegaly Chest: normal to palpation Lungs:moving air well bilaterraly Cardiac: Regular rate, 2+ pulses Abd: Soft, nontender, nondistended.  No organomegaly. Bruises in the lower abdominal Ext: No clubbing,  cyanosis, edema Neuro: CN grossly intact. Moves all extremities Derm: no significant rashes or nodules Lymph: no cervical or supraclavicular nodes  Renal Ultrasound 06/15/2015 - Images reviewed. Outside reports reviewed. CLINICAL DATA: Acute renal failure  EXAM: RENAL / URINARY TRACT ULTRASOUND COMPLETE  COMPARISON: None.  FINDINGS: Right Kidney:  Length: 13 cm. Mild fullness of the intrarenal collecting system. An echogenic focus that was measured is not convincing for stone given lack of shadowing. No cortical thinning or evidence of mass.  Left Kidney:  Length: 13 cm. Echogenicity within normal limits. No mass or hydronephrosis visualized. Measured echogenic structure not convincing for stone given lack of shadowing.  Bladder:  There is a 15 mm flat, partially  hypoechoic mass along the right lower bladder wall. No internal stone is seen.  IMPRESSION: 1. No bilateral hydronephrosis to explain acute renal failure. 2. Mild right hydronephrosis with a 15 mm bladder mass near the right ureteral orifice. This could reflect a transitional cell carcinoma or edema at the ureteral orifice and renal protocol CT or cystoscopy recommended.   Electronically Signed  By: Monte Fantasia M.D.  On: 06/15/2015 09:22                                  Urinalysis - 05/31/2015 - negative for RBCs  Assessment/Plan 1) Right Hydronephrosis - mild 2) Possible Bladder Mass 3) History of urethral stricture 4) Renal insufficiency  I had a lengthy discussion with the patient regarding his ultrasound findings.  Available imaging and labs were reviewed. He does have mild hydronephrosis and an abnormal flat hypoechoic region in the bladder wall.  It is unclear from this imaging what this represents.  However, this could be a benign pathology or malignant pathology which warrants further investigation.  Given his bladder stone history and GU manipulation in 2011, it is reasonable to consider this in the differential.  A bladder tumor would also presen itself as a bladder lesion.  Imaging is typically not adequate to evaluate bladder pathology thus I recommend he undergo a cystoscopy.  He is a [place] patient and will be returning there in the coming days. He wishes to be seen at [facility] for his outpatient cystoscopy. I think this is reasonable.  I will contact the [facility] urology department to request a follow up for him within the next 2-4 weeks.   In addition to a cystoscopy, he warrants a triple phase CT in order to do a thorough work up of his bladder lesion. In the setting of recent AKI, he is unable to undergo a contrast load and thus this would not be reasonable at the current time. Once he recovers from his renal insufficiency, this would be  warranted.  Cystoscopy can be done first given that if any findings require intraoperative management then retrograde pyelograms can be performed.  I do not suspect a renal stone given the lack of renal colic, hematuria, dysuria, frequency, or pain. However, should his renal function continue to deteriorate, a non-contrasted CT is a reasonable option now.   - Cystoscopy at [facility] - Will contact [place] urology to ensure a 2-4 week cystoscopy visit - Triple phase CT of A/P (contrasted with excretory images) once renal function improves - can be done after cystoscopy - CT A/P - non-contrasted should his renal function continue to deteriorate or develops flank pain.    This is a high complexity case secondary to  numerous medical co-morbidities, extensive chart review, patient counseling, and coordination of care.   Jadasia Haws J Madden-Fuentes 06/15/2015, 3:32 PM

## 2015-06-15 NOTE — Progress Notes (Signed)
Patient complaining of phlegm, and cant cough it up. Dr Amado CoeGouru paged to possibly get an order to help cough up the phlegm.

## 2015-06-15 NOTE — Progress Notes (Signed)
[facility]  ROUNDING NOTE   Subjective:  Renal function worse today. Cr up to 2.38. UOP recorded as 730.   [facility] Possible lesion in the bladder noted, mild right sided hydronephrosis.     Objective:  Vital signs in last 24 hours:  Temp:  [97.3 F (36.3 C)-97.6 F (36.4 C)] 97.6 F (36.4 C) (12/03 0901) Pulse Rate:  [61-70] 61 (12/03 0901) Resp:  [17-20] 17 (12/03 0901) BP: (113-138)/(50-72) 120/50 mmHg (12/03 0901) SpO2:  [98 %-100 %] 100 % (12/03 0901)  Weight change:  Filed Weights   06/10/15 1907  Weight: 115.667 kg (255 lb)    Intake/Output: I/O last 3 completed shifts: In: 1080 [P.O.:1080] Out: 730 [Urine:730]   Intake/Output this shift:  Total I/O In: 1512.5 [P.O.:480; I.V.:1032.5] Out: 0   Physical Exam: General: NAD, sitting up  Head: Normocephalic, atraumatic. Moist oral mucosal membranes  Eyes: Anicteric  Neck: Supple, trachea midline  Lungs:  Bilateral rhonchi normal effort  Heart: S1S2 no rubs  Abdomen:  Soft, nontender, BS present   Extremities:  2+ peripheral edema.  Neurologic: Nonfocal, moving all four extremities  Skin: No lesions       Basic Metabolic Panel:  Recent Labs Lab 06/10/15 1926 06/11/15 0634 06/12/15 1745 06/13/15 0544 06/14/15 0432 06/15/15 0424  NA 137 139  --  140 134* 135  K 4.0 4.4  --  4.6 4.0 3.9  CL 101 101  --  100* 94* 96*  CO2 27 30  --  29 28 27   GLUCOSE 99 159*  --  161* 144* 153*  BUN 10 12  --  34* 41* 48*  CREATININE 1.33* 1.39* 1.78* 1.76* 2.11* 2.38*  CALCIUM 8.6* 8.4*  --  8.7* 8.1* 7.9*    Liver Function Tests:  Recent Labs Lab 06/10/15 1926 06/14/15 0432  AST 30 29  ALT 26 22  ALKPHOS 64 58  BILITOT 0.6 0.5  PROT 7.1 6.6  ALBUMIN 2.7* 2.6*   No results for input(s): LIPASE, AMYLASE in the last 168 hours. No results for input(s): AMMONIA in the last 168 hours.  CBC:  Recent Labs Lab 06/10/15 1926 06/11/15 0634 06/13/15 0544 06/14/15 0432  06/15/15 0424  WBC 9.3 7.4 20.9* 20.7* 16.2*  NEUTROABS 7.1*  --   --   --   --   HGB 8.5* 8.7* 8.7* 8.8* 8.8*  HCT 26.7* 27.3* 27.9* 28.2* 28.0*  MCV 88.7 88.4 88.8 88.1 87.6  PLT 234 222 267 263 260    Cardiac Enzymes:  Recent Labs Lab 06/10/15 1926  TROPONINI <0.03    BNP: Invalid input(s): POCBNP  CBG:  Recent Labs Lab 06/14/15 1115 06/14/15 1645 06/14/15 2106 06/15/15 0902 06/15/15 1102  GLUCAP 138* 145* 152* 126* 168*    Microbiology: Results for orders placed or performed during the hospital encounter of 06/10/15  C difficile quick scan w PCR reflex     Status: None   Collection Time: 06/11/15  9:04 AM  Result Value Ref Range Status   C Diff antigen NEGATIVE NEGATIVE Final   C Diff toxin NEGATIVE NEGATIVE Final   C Diff interpretation Negative for C. difficile  Corrected    Comment: CORRECTED ON 11/29 AT 1135: PREVIOUSLY REPORTED AS VALID    Coagulation Studies: No results for input(s): LABPROT, INR in the last 72 hours.  Urinalysis: No results for input(s): COLORURINE, LABSPEC, PHURINE, GLUCOSEU, HGBUR, BILIRUBINUR, KETONESUR, PROTEINUR, UROBILINOGEN, NITRITE, LEUKOCYTESUR in the last 72 hours.  Invalid input(s):  APPERANCEUR    Imaging: US Renal  06/15/2015  CLINICAL DATA:  Acute renal failure EXAM: RENAL / URINARY TRACT ULTRASOUND COMPLETE COMPARISON:  None. FINDINGS: Right Kidney: Length: 13 cm. Mild fullness of the intrarenal collecting system. An echogenic focus that was measured is not convincing for stone given lack of shadowing. No cortical thinning or evidence of mass. Left Kidney: Length: 13 cm. Echogenicity within normal limits. No mass or hydronephrosis visualized. Measured echogenic structure not convincing for stone given lack of shadowing. Bladder: There is a 15 mm flat, partially hypoechoic mass along the right lower bladder wall. No internal stone is seen. IMPRESSION: 1. No bilateral hydronephrosis to explain acute renal failure. 2. Mild  right hydronephrosis with a 15 mm bladder mass near the right ureteral orifice. This could reflect a transitional cell carcinoma or edema at the ureteral orifice and renal protocol CT or cystoscopy recommended. Electronically Signed   By: Marnee Spring M.D.   On: 06/15/2015 09:22     Medications:     . apixaban  5 mg Oral BID  . carvedilol  6.25 mg Oral BID  . diltiazem  360 mg Oral Daily  . fluticasone  2 spray Each Nare Daily  . gabapentin  300 mg Oral TID  . guaiFENesin  600 mg Oral BID  . methylPREDNISolone (SOLU-MEDROL) injection  80 mg Intravenous Q12H  . pravastatin  40 mg Oral QHS  . sodium chloride  3 mL Intravenous Q12H   acetaminophen **OR** acetaminophen, ipratropium-albuterol, ondansetron **OR** ondansetron (ZOFRAN) IV, sodium chloride  Assessment/ Plan:  69 y.o. male with a PMHx of COPD, diabetes mellitus, hypertension, history of DVT, subdural hematoma, paroxysmal atrial for ablation, asthma, who was admitted to [facility] on 06/10/2015 for evaluation of shortness of breath.   1. Acute renal failure. Secondary to contrast exposure most likely. On 06/10/15 creatinine was 1.3. Thereafter patient was exposed to contrast and creatinine began to rise. -Cr continues to rise.  Would continue IVF hydration.  Hopefully Cr will plateau.  No indication for HD at the moment.  2. Bacterial pneumonia. Antibiotics stopped, pending transfer to [place].  3. Anemia unspecified.  Hgb 8.8, awaiting spep and upep.  4.  Right sided hydronephrosis/possible bladder mass: we have ordered urology consult, however pt may transfer to [place] prior to this.   LOS: 5 Thomas [name] 12/3/20162:33 PM

## 2015-06-15 NOTE — Progress Notes (Signed)
Call from RN to request CSW call for an update on patient's bed.  [facility] [phone] ext [phone]   spoke to Onalee Huaavid who confirmed that patient's bed is pending.  Informed CSW they would call as soon as they have a bed for patient.  CSW updated RN.  Sammuel Hineseborah Moore. Theresia MajorsLCSWA, MSW Clinical Social Work Department [phone] 9:12 AM

## 2015-06-16 ENCOUNTER — Ambulatory Visit (HOSPITAL_COMMUNITY)
Admission: AD | Admit: 2015-06-16 | Discharge: 2015-06-16 | Disposition: A | Discharge status: Home / Self Care | Payer: Non-veteran care | Source: Other Acute Inpatient Hospital | Attending: Internal Medicine | Admitting: Internal Medicine

## 2015-06-16 DIAGNOSIS — J189 Pneumonia, unspecified organism: Secondary | ICD-10-CM | POA: Insufficient documentation

## 2015-06-16 LAB — BASIC METABOLIC PANEL
ANION GAP: 8 (ref 5–15)
BUN: 51 mg/dL — ABNORMAL HIGH (ref 6–20)
CHLORIDE: 101 mmol/L (ref 101–111)
CO2: 28 mmol/L (ref 22–32)
Calcium: 7.6 mg/dL — ABNORMAL LOW (ref 8.9–10.3)
Creatinine, Ser: 2.09 mg/dL — ABNORMAL HIGH (ref 0.61–1.24)
GFR calc Af Amer: 36 mL/min — ABNORMAL LOW (ref >60)
GFR calc non Af Amer: 31 mL/min — ABNORMAL LOW (ref >60)
GLUCOSE: 155 mg/dL — AB (ref 65–99)
POTASSIUM: 3.5 mmol/L (ref 3.5–5.1)
Sodium: 137 mmol/L (ref 135–145)

## 2015-06-16 LAB — GLUCOSE, CAPILLARY: Glucose-Capillary: 160 mg/dL — ABNORMAL HIGH (ref 65–99)

## 2015-06-16 MED ORDER — METHYLPREDNISOLONE SODIUM SUCC 125 MG IJ SOLR: 60.0000 mg | Freq: Two times a day (BID) | INTRAMUSCULAR | Status: DC

## 2015-06-16 MED ORDER — FLUTICASONE PROPIONATE 50 MCG/ACT NA SUSP: 2.0000 | Freq: Every day | NASAL | Status: AC

## 2015-06-16 MED ORDER — SODIUM CHLORIDE 0.9 % IJ SOLN: 3.0000 mL | Freq: Two times a day (BID) | INTRAMUSCULAR | Status: AC

## 2015-06-16 MED ORDER — ACETAMINOPHEN 325 MG PO TABS: 650.0000 mg | ORAL_TABLET | Freq: Four times a day (QID) | ORAL | Status: AC | PRN

## 2015-06-16 MED ORDER — IPRATROPIUM-ALBUTEROL 0.5-2.5 (3) MG/3ML IN SOLN: 3.0000 mL | RESPIRATORY_TRACT | Status: AC | PRN

## 2015-06-16 MED ORDER — ONDANSETRON HCL 4 MG PO TABS: 4.0000 mg | ORAL_TABLET | Freq: Four times a day (QID) | ORAL | Status: AC | PRN

## 2015-06-16 MED ORDER — GUAIFENESIN ER 600 MG PO TB12: 600.0000 mg | ORAL_TABLET | Freq: Two times a day (BID) | ORAL | Status: AC

## 2015-06-16 MED ORDER — SALINE SPRAY 0.65 % NA SOLN: 1.0000 | NASAL | Status: AC | PRN

## 2015-06-16 MED ORDER — METHYLPREDNISOLONE SODIUM SUCC 125 MG IJ SOLR: 60.0000 mg | Freq: Two times a day (BID) | INTRAMUSCULAR | Status: AC

## 2015-06-16 NOTE — Progress Notes (Signed)
[facility]  ROUNDING NOTE   Subjective:  Renal function has improved slightly today as creatinine is down to 2.09. Appreciate urology input. Patient will require cystoscopy at some point in time. [place] [facility].  Objective:  Vital signs in last 24 hours:  Temp:  [97.4 F (36.3 C)-97.8 F (36.6 C)] 97.4 F (36.3 C) (12/04 0730) Pulse Rate:  [65-70] 66 (12/04 0730) Resp:  [17-18] 18 (12/04 0730) BP: (101-123)/(42-69) 123/67 mmHg (12/04 0730) SpO2:  [91 %-97 %] 96 % (12/04 0730)  Weight change:  Filed Weights   06/10/15 1907  Weight: 115.667 kg (255 lb)    Intake/Output: I/O last 3 completed shifts: In: 2807.5 [P.O.:960; I.V.:1847.5] Out: 0    Intake/Output this shift:  Total I/O In: 1290 [P.O.:600; I.V.:690] Out: 0   Physical Exam: General: NAD, sitting up  Head: Normocephalic, atraumatic. Moist oral mucosal membranes  Eyes: Anicteric  Neck: Supple, trachea midline  Lungs:  Bilateral rhonchi normal effort  Heart: S1S2 no rubs  Abdomen:  Soft, nontender, BS present   Extremities:  2+ peripheral edema.  Neurologic: Nonfocal, moving all four extremities  Skin: No lesions       Basic Metabolic Panel:  Recent Labs Lab 06/11/15 0634 06/12/15 1745 06/13/15 0544 06/14/15 0432 06/15/15 0424 06/16/15 0401  NA 139  --  140 134* 135 137  K 4.4  --  4.6 4.0 3.9 3.5  CL 101  --  100* 94* 96* 101  CO2 30  --  29 28 27 28   GLUCOSE 159*  --  161* 144* 153* 155*  BUN 12  --  34* 41* 48* 51*  CREATININE 1.39* 1.78* 1.76* 2.11* 2.38* 2.09*  CALCIUM 8.4*  --  8.7* 8.1* 7.9* 7.6*    Liver Function Tests:  Recent Labs Lab 06/10/15 1926 06/14/15 0432  AST 30 29  ALT 26 22  ALKPHOS 64 58  BILITOT 0.6 0.5  PROT 7.1 6.6  ALBUMIN 2.7* 2.6*   No results for input(s): LIPASE, AMYLASE in the last 168 hours. No results for input(s): AMMONIA in the last 168 hours.  CBC:  Recent Labs Lab 06/10/15 1926 06/11/15 0634  06/13/15 0544 06/14/15 0432 06/15/15 0424  WBC 9.3 7.4 20.9* 20.7* 16.2*  NEUTROABS 7.1*  --   --   --   --   HGB 8.5* 8.7* 8.7* 8.8* 8.8*  HCT 26.7* 27.3* 27.9* 28.2* 28.0*  MCV 88.7 88.4 88.8 88.1 87.6  PLT 234 222 267 263 260    Cardiac Enzymes:  Recent Labs Lab 06/10/15 1926  TROPONINI <0.03    BNP: Invalid input(s): POCBNP  CBG:  Recent Labs Lab 06/15/15 0902 06/15/15 1102 06/15/15 1535 06/15/15 2201 06/16/15 1128  GLUCAP 126* 168* 195* 169* 160*    Microbiology: Results for orders placed or performed during the hospital encounter of 06/10/15  C difficile quick scan w PCR reflex     Status: None   Collection Time: 06/11/15  9:04 AM  Result Value Ref Range Status   C Diff antigen NEGATIVE NEGATIVE Final   C Diff toxin NEGATIVE NEGATIVE Final   C Diff interpretation Negative for C. difficile  Corrected    Comment: CORRECTED ON 11/29 AT 1135: PREVIOUSLY REPORTED AS VALID    Coagulation Studies: No results for input(s): LABPROT, INR in the last 72 hours.  Urinalysis: No results for input(s): COLORURINE, LABSPEC, PHURINE, GLUCOSEU, HGBUR, BILIRUBINUR, KETONESUR, PROTEINUR, UROBILINOGEN, NITRITE, LEUKOCYTESUR in the last 72 hours.  Invalid  input(s): APPERANCEUR    Imaging: US Renal  06/15/2015  CLINICAL DATA:  Acute renal failure EXAM: RENAL / URINARY TRACT ULTRASOUND COMPLETE COMPARISON:  None. FINDINGS: Right Kidney: Length: 13 cm. Mild fullness of the intrarenal collecting system. An echogenic focus that was measured is not convincing for stone given lack of shadowing. No cortical thinning or evidence of mass. Left Kidney: Length: 13 cm. Echogenicity within normal limits. No mass or hydronephrosis visualized. Measured echogenic structure not convincing for stone given lack of shadowing. Bladder: There is a 15 mm flat, partially hypoechoic mass along the right lower bladder wall. No internal stone is seen. IMPRESSION: 1. No bilateral hydronephrosis to explain  acute renal failure. 2. Mild right hydronephrosis with a 15 mm bladder mass near the right ureteral orifice. This could reflect a transitional cell carcinoma or edema at the ureteral orifice and renal protocol CT or cystoscopy recommended. Electronically Signed   By: Marnee Spring M.D.   On: 06/15/2015 09:22     Medications:   . sodium chloride 75 mL/hr at 06/15/15 1508   . apixaban  5 mg Oral BID  . carvedilol  6.25 mg Oral BID  . diltiazem  360 mg Oral Daily  . fluticasone  2 spray Each Nare Daily  . gabapentin  300 mg Oral TID  . guaiFENesin  600 mg Oral BID  . methylPREDNISolone (SOLU-MEDROL) injection  80 mg Intravenous Q12H  . pravastatin  40 mg Oral QHS  . sodium chloride  3 mL Intravenous Q12H   acetaminophen **OR** acetaminophen, ipratropium-albuterol, ondansetron **OR** ondansetron (ZOFRAN) IV, sodium chloride  Assessment/ Plan:  69 y.o. male with a PMHx of COPD, diabetes mellitus, hypertension, history of DVT, subdural hematoma, paroxysmal atrial for ablation, asthma, who was admitted to [facility] on 06/10/2015 for evaluation of shortness of breath.   1. Acute renal failure. Secondary to contrast exposure most likely. On 06/10/15 creatinine was 1.3. Thereafter patient was exposed to contrast and creatinine began to rise.  Right sided hydronephrosis noted as well. -right-sided hydronephrosis could be contributing to her renal dysfunction as is the contrast exposure.  Creatinine down to 2.09 at present.  Continue 0.9 normal saline at 75 cc per hour for one additional day. Patient pending transfer to the [place] and we recommend nephrology followup there.  2. Bacterial pneumonia. Antibiotics stopped, pending transfer to [place].  3. Anemia unspecified.  Hgb 8.8, awaiting spep and upep.  4.  Right sided hydronephrosis/possible bladder mass: appreciate urology consultation.  He does appear to have a complex case.  He will end up requiring cystoscopy at a minimum for further evaluation.  Hopefully urology followup can be obtained at the [place].    LOS: 6 Rita Prom 12/4/20161:51 PM

## 2015-06-16 NOTE — Progress Notes (Signed)
[facility] at [phone] ext [phone], no answer, will keep trying.

## 2015-06-16 NOTE — Progress Notes (Signed)
[facility] [place] [facility].

## 2015-06-16 NOTE — Discharge Summary (Signed)
Marland Kitchen.Marland Kitchen.Marland Kitchen.[employer] [facility] [place] [facility]   PATIENT NAME: Thomas BarterJames [name]    MR#:  [id number]  DATE OF BIRTH:  1945-10-03  DATE OF ADMISSION:  06/10/2015 ADMITTING PHYSICIAN: Oralia Manisavid Willis, MD  DATE OF DISCHARGE: No discharge date for patient encounter.  PRIMARY CARE PHYSICIAN: No PCP Per Patient    ADMISSION DIAGNOSIS:  Hypoxia [R09.02] Healthcare-associated pneumonia [J18.9]  DISCHARGE DIAGNOSIS:  Principal Problem:   Healthcare-associated pneumonia Active Problems:   HTN (hypertension)   Type 2 diabetes mellitus (HCC)   Paroxysmal a-fib (HCC)   COPD exacerbation (HCC)   Other hydronephrosis   Urethral stricture   SECONDARY DIAGNOSIS:   Past Medical History  Diagnosis Date  . COPD (chronic obstructive pulmonary disease) (HCC)   . Diabetes mellitus without complication (HCC)   . Hypertension   . DVT (deep venous thrombosis) (HCC)   . Subdural hematoma (HCC)   . Paroxysmal atrial fibrillation (HCC)   . Asthma     HOSPITAL COURSE:   69 y.o. male who presented with persistent and progressive shortness of breath, as well as cough and wheezing admitted for  * Healthcare-associated pneumonia - prob chronic changes in CT and unlikely active pna, ID has recommended to discontinue Zyvox and zosyn Appreciate ID recs  * ARF: may have underlying CKD. nephro recommended IVF , will continue IV fluids.creatinine at 2.09 today, avoid nephrotoxinS Rpt am labs Renal ultrasound has revealed right-sided hydronephrosis and possible bladder lesion.  * Hydronephrosis and bladder lesion [facility]rology [facility] renal fx improves-2-4 weeks  Triple phase CT of A/P (contrasted with excretory images) once renal function improves - can be done after cystoscopy - CT A/P - non-contrasted should his renal function continue to deteriorate or develops flank pain.     * COPD exacerbation (chronic obstructive pulmonary disease)  -  continue IV steroids and DuoNeb's plus antibiotics as above. Appreciate Pulmo input. - BiPAP at night and as need  -Maintain O2 saturations are 88%  -Review of the CT shows chronic emphysematous changes along with lower lobe scarring, may be some component of aspiration - overall poor psycho-social situations making him high risk for recurrent admissions.  * HTN (hypertension) - continue coreg, cardizem   * Type 2 diabetes mellitus (HCC) - not on any anti-glycemic medications at home. Hemoglobin A1c 6.1. monitor  * Paroxysmal a-fib (HCC) - continue home rate controlling medication as well as anticoagulation. On Eliquis and cardizem, coreg.   [facility]  All the records are reviewed and case discussed with Care Management/Social Worker. Management plans discussed with the patient, family and they are in agreement.  CODE STATUS: Full Code  DISCHARGE CONDITIONS:   Fair  CONSULTS OBTAINED:  Treatment Team:  Clydie Braunavid Fitzgerald, MD Munsoor Cherylann RatelLateef, MD Ramonita LabAruna Edessa Jakubowicz, MD Eliot Fordamiro J Madden-Fuentes, MD   PROCEDURES none  DRUG ALLERGIES:  No Known Allergies  DISCHARGE MEDICATIONS:   Current Discharge Medication List    START taking these medications   Details  acetaminophen (TYLENOL) 325 MG tablet Take 2 tablets (650 mg total) by mouth every 6 (six) hours as needed for mild pain (or Fever >/= 101).    fluticasone (FLONASE) 50 MCG/ACT nasal spray Place 2 sprays into both nostrils daily. Qty: 16 g, Refills: 0    guaiFENesin (MUCINEX) 600 MG 12 hr tablet Take 1 tablet (600 mg total) by mouth 2 (two) times daily.    ipratropium-albuterol (DUONEB) 0.5-2.5 (3) MG/3ML SOLN Take 3 mLs  by nebulization every 4 (four) hours as needed. Qty: 360 mL, Refills: 0    methylPREDNISolone sodium succinate (SOLU-MEDROL) 125 mg/2 mL injection Inject 0.96 mLs (60 mg total) into the vein every 12 (twelve) hours. Qty: 1 each, Refills: 0    ondansetron (ZOFRAN) 4 MG tablet Take  1 tablet (4 mg total) by mouth every 6 (six) hours as needed for nausea. Qty: 20 tablet, Refills: 0    sodium chloride (OCEAN) 0.65 % SOLN nasal spray Place 1 spray into both nostrils as needed for congestion. Qty: 88 mL, Refills: 0    sodium chloride 0.9 % injection Inject 3 mLs into the vein every 12 (twelve) hours. Qty: 5 mL, Refills: 0      CONTINUE these medications which have NOT CHANGED   Details  apixaban (ELIQUIS) 5 MG TABS tablet Take 1 tablet (5 mg total) by mouth 2 (two) times daily. Qty: 60 tablet, Refills: 3    carvedilol (COREG) 6.25 MG tablet Take 6.25 mg by mouth 2 (two) times daily.    cholecalciferol 2000 UNITS TABS Take 1 tablet (2,000 Units total) by mouth daily. Qty: 30 tablet, Refills: 0    diltiazem (CARDIZEM CD) 360 MG 24 hr capsule Take 360 mg by mouth daily.    gabapentin (NEURONTIN) 300 MG capsule Take 300 mg by mouth 3 (three) times daily.    Melatonin 3 MG TABS Take 3 mg by mouth at bedtime as needed (for sleep).    Multiple Vitamin (MULTIVITAMIN WITH MINERALS) TABS tablet Take 1 tablet by mouth daily.    niacin 100 MG tablet Take 300 mg by mouth 3 (three) times daily with meals.    omega-3 acid ethyl esters (LOVAZA) 1 G capsule Take 2 g by mouth 2 (two) times daily.    potassium chloride SA (K-DUR,KLOR-CON) 20 MEQ tablet Take 20 mEq by mouth daily.    pravastatin (PRAVACHOL) 40 MG tablet Take 40 mg by mouth at bedtime.    Pyridoxine HCl (VITAMIN B-6) 500 MG tablet Take 1,000 mg by mouth daily.    thiamine (VITAMIN B-1) 100 MG tablet Take 100 mg by mouth daily.    vitamin B-12 (CYANOCOBALAMIN) 1000 MCG tablet Take 1,000 mcg by mouth daily.    Vitamin D-Vitamin K (VITAMIN K2-VITAMIN D3) 45-2000 MCG-UNIT CAPS Take 1 capsule by mouth daily.       Continue current inpatient medications along with reconsiled  home medications  DISCHARGE INSTRUCTIONS:   As recommended by [facility] physicians  DIET:  Diabetic diet  DISCHARGE CONDITION:   Fair  ACTIVITY:  Activity as tolerated  OXYGEN:  Home Oxygen: Yes.     Oxygen Delivery: 3-4 liters/min via Patient connected to nasal cannula oxygen  DISCHARGE LOCATION:  [facility] hospital   If you experience worsening of your admission symptoms, develop shortness of breath, life threatening emergency, suicidal or homicidal thoughts you must seek medical attention immediately by calling 911 or calling your MD immediately  if symptoms less severe.  You Must read complete instructions/literature along with all the possible adverse reactions/side effects for all the Medicines you take and that have been prescribed to you. Take any new Medicines after you have completely understood and accpet all the possible adverse reactions/side effects.   Please note  You were cared for by a hospitalist during your hospital stay. If you have any questions about your discharge medications or the care you received while you were in the hospital after you are discharged, you can call the unit  and asked to speak with the hospitalist on call if the hospitalist that took care of you is not available. Once you are discharged, your primary care physician will handle any further medical issues. Please note that NO REFILLS for any discharge medications will be authorized once you are discharged, as it is imperative that you return to your primary care physician (or establish a relationship with a primary care physician if you do not have one) for your aftercare needs so that they can reassess your need for medications and monitor your lab values.     Today  Chief Complaint  Patient presents with  . Shortness of Breath     ROS: CONSTITUTIONAL: Denies fevers, chills. Denies any fatigue, weakness.  EYES: Denies blurry vision, double vision, eye pain. EARS, NOSE, THROAT: Denies tinnitus, ear pain, hearing loss. RESPIRATORY: Reporting  cough, wheeze, shortness of breath with exertion  CARDIOVASCULAR: Denies  chest pain, palpitations, edema.  GASTROINTESTINAL: Denies nausea, vomiting, diarrhea, abdominal pain. Denies bright red blood per rectum. GENITOURINARY: Denies dysuria, hematuria. ENDOCRINE: Denies nocturia or thyroid problems. HEMATOLOGIC AND LYMPHATIC: Denies easy bruising or bleeding. SKIN: Denies rash or lesion. MUSCULOSKELETAL: Denies pain in neck, back, shoulder, knees, hips or arthritic symptoms.  NEUROLOGIC: Denies paralysis, paresthesias.  PSYCHIATRIC: Denies anxiety or depressive symptoms.   VITAL SIGNS:  Blood pressure 123/67, pulse 66, temperature 97.4 F (36.3 C), temperature source Oral, resp. rate 18, height  (1.854 m), weight 115.667 kg (255 lb), SpO2 96 %.  I/O:    Intake/Output Summary (Last 24 hours) at 06/16/15 1411 Last data filed at 06/16/15 1112  Gross per 24 hour  Intake   2345 ml  Output      0 ml  Net   2345 ml    PHYSICAL EXAMINATION:  GENERAL:  69 y.o.-year-old patient lying in the bed with no acute distress.  EYES: Pupils equal, round, reactive to light and accommodation. No scleral icterus. Extraocular muscles intact.  HEENT: Head atraumatic, normocephalic. Oropharynx and nasopharynx clear.  NECK:  Supple, no jugular venous distention. No thyroid enlargement, no tenderness.  LUNGS: Coarse breath sounds bilaterally, moderate air entry ,no wheezing, rales,rhonchi or crepitation. No use of accessory muscles of respiration.  CARDIOVASCULAR: S1, S2 normal. No murmurs, rubs, or gallops.  ABDOMEN: Soft, non-tender, non-distended. Bowel sounds present. No organomegaly or mass.  EXTREMITIES: No pedal edema, cyanosis, or clubbing.  NEUROLOGIC: Cranial nerves II through XII are intact. Muscle strength 5/5 in all extremities. Sensation intact. Gait not checked.  PSYCHIATRIC: The patient is alert and oriented x 3.  SKIN: No obvious rash, lesion, or ulcer.   DATA REVIEW:   CBC  Recent Labs Lab 06/15/15 0424  WBC 16.2*  HGB 8.8*  HCT 28.0*  PLT  260    Chemistries   Recent Labs Lab 06/14/15 0432  06/16/15 0401  NA 134*  < > 137  K 4.0  < > 3.5  CL 94*  < > 101  CO2 28  < > 28  GLUCOSE 144*  < > 155*  BUN 41*  < > 51*  CREATININE 2.11*  < > 2.09*  CALCIUM 8.1*  < > 7.6*  AST 29  --   --   ALT 22  --   --   ALKPHOS 58  --   --   BILITOT 0.5  --   --   < > = values in this interval not displayed.  Cardiac Enzymes  Recent Labs Lab 06/10/15 1926  TROPONINI <  0.03    Microbiology Results  Results for orders placed or performed during the hospital encounter of 06/10/15  C difficile quick scan w PCR reflex     Status: None   Collection Time: 06/11/15  9:04 AM  Result Value Ref Range Status   C Diff antigen NEGATIVE NEGATIVE Final   C Diff toxin NEGATIVE NEGATIVE Final   C Diff interpretation Negative for C. difficile  Corrected    Comment: CORRECTED ON 11/29 AT 1135: PREVIOUSLY REPORTED AS VALID    RADIOLOGY:  US Renal  06/15/2015  CLINICAL DATA:  Acute renal failure EXAM: RENAL / URINARY TRACT ULTRASOUND COMPLETE COMPARISON:  None. FINDINGS: Right Kidney: Length: 13 cm. Mild fullness of the intrarenal collecting system. An echogenic focus that was measured is not convincing for stone given lack of shadowing. No cortical thinning or evidence of mass. Left Kidney: Length: 13 cm. Echogenicity within normal limits. No mass or hydronephrosis visualized. Measured echogenic structure not convincing for stone given lack of shadowing. Bladder: There is a 15 mm flat, partially hypoechoic mass along the right lower bladder wall. No internal stone is seen. IMPRESSION: 1. No bilateral hydronephrosis to explain acute renal failure. 2. Mild right hydronephrosis with a 15 mm bladder mass near the right ureteral orifice. This could reflect a transitional cell carcinoma or edema at the ureteral orifice and renal protocol CT or cystoscopy recommended. Electronically Signed   By: Marnee Spring M.D.   On: 06/15/2015 09:22    EKG:    Orders placed or performed during the hospital encounter of 06/10/15  . EKG 12-Lead  . EKG 12-Lead      Management plans discussed with the patient, family and they are in agreement.  CODE STATUS:     Code Status Orders        Start     Ordered   06/11/15 0132  Full code   Continuous     06/11/15 0131      TOTAL TIME TAKING CARE OF THIS PATIENT:45 minutes    @  on 06/16/2015 at 2:11 PM  Between 7am to 6pm - Pager - [phone]  After 6pm go to [email or web address] - password [email or web address] [facility]  [facility]  Office  [phone]  CC: Primary care physician; No PCP Per Patient

## 2015-06-16 NOTE — Care Management Note (Signed)
Case Management Note  Patient Details  Name: Lovie CholJames M Daidone MRN: [id number] Date of Birth: 09/04/1945  Subjective/Objective:    [facility] and [facility] to [facility]. Marchelle FolksAmanda RN will call nurse to nurse report.  Marchelle Folksmanda RN is aware that an MD will need to complete an Emtala and a Discharge Summary to go into transfer packet prior to transport.                 Action/Plan:   Expected Discharge Date:                  Expected Discharge Plan:     In-House Referral:     Discharge planning Services  CM Consult  Post Acute Care Choice:  Durable Medical Equipment, Home Health Choice offered to:  Patient  DME Arranged:    DME Agency:     HH Arranged:    HH Agency:  [facility]  Status of Service:  In process, will continue to follow  Medicare Important Message Given:    Date Medicare IM Given:    Medicare IM give by:    Date Additional Medicare IM Given:    Additional Medicare Important Message give by:     If discussed at Long Length of Stay Meetings, dates discussed:    Additional Comments:  Makaela Cando A, RN 06/16/2015, 1:47 PM

## 2015-06-16 NOTE — Progress Notes (Signed)
[facility]. Attempted to call wife and daughter, no answer. Will try again soon.

## 2015-06-16 NOTE — Progress Notes (Signed)
[facility] called for transport. 

## 2015-06-16 NOTE — Care Management Note (Signed)
Case Management Note  Patient Details  Name: Thomas [name] MRN: [id number] Date of Birth: 01-22-1946  Subjective/Objective:   Marcellina MillinCalled Brad at the [facility] at [phone] ext [phone] requesting an update on the transfer status of [name] [facility]. Brad requested additional clinical information be faxed to him at Fax: [phone] which was sent. Continue to await [facility] availability at [facility].                  Action/Plan:   Expected Discharge Date:                  Expected Discharge Plan:     In-House Referral:     Discharge planning Services  CM Consult  Post Acute Care Choice:  Durable Medical Equipment, Home Health Choice offered to:  Patient  DME Arranged:    DME Agency:     HH Arranged:    HH Agency:  [facility]  Status of Service:  In process, will continue to follow  Medicare Important Message Given:    Date Medicare IM Given:    Medicare IM give by:    Date Additional Medicare IM Given:    Additional Medicare Important Message give by:     If discussed at Long Length of Stay Meetings, dates discussed:    Additional Comments:  Thomas [name] A, RN 06/16/2015, 8:21 AM

## 2015-06-17 LAB — PROTEIN ELECTROPHORESIS, SERUM
A/G RATIO SPE: 1 (ref 0.7–1.7)
Albumin ELP: 2.8 g/dL — ABNORMAL LOW (ref 2.9–4.4)
Alpha-1-Globulin: 0.3 g/dL (ref 0.0–0.4)
Alpha-2-Globulin: 0.8 g/dL (ref 0.4–1.0)
BETA GLOBULIN: 1.1 g/dL (ref 0.7–1.3)
GAMMA GLOBULIN: 0.8 g/dL (ref 0.4–1.8)
Globulin, Total: 2.9 g/dL (ref 2.2–3.9)
TOTAL PROTEIN ELP: 5.7 g/dL — AB (ref 6.0–8.5)

## 2015-06-17 LAB — PROTEIN ELECTRO, RANDOM URINE
ALPHA-2-GLOBULIN, U: 0 %
Albumin ELP, Urine: 100 %
Alpha-1-Globulin, U: 0 %
BETA GLOBULIN, U: 0 %
Gamma Globulin, U: 0 %
Total Protein, Urine: 4.4 mg/dL

## 2015-06-17 LAB — EOSINOPHIL, URINE: Eosinophil, Urine: NONE SEEN %

## 2015-06-17 NOTE — Progress Notes (Signed)
[facility] transferred [facility] over the weekend. Clinical Child psychotherapistocial Worker (CSW) left Randi APS worker voicemail today making her aware of above.   Jetta LoutBailey Morgan, LCSWA [phone]

## 2015-06-24 ENCOUNTER — Other Ambulatory Visit: Payer: Self-pay | Admitting: Family

## 2016-11-10 DEATH — deceased

## 2017-05-26 IMAGING — CT CT ANGIO CHEST
1 of 2 series · 18 of 30 positions shown · IV contrast (APPLIED)
Comparison: Radiographs 06/10/2015

CLINICAL DATA: Recent treatment for pneumonia.  Hypoxia.  Dyspnea.

EXAM:
CT ANGIOGRAPHY CHEST WITH CONTRAST
TECHNIQUE: Multidetector CT imaging of the chest was performed using the
standard protocol during bolus administration of intravenous
contrast. Multiplanar CT image reconstructions and MIPs were
obtained to evaluate the vascular anatomy.
CONTRAST:  75mL OMNIPAQUE IOHEXOL 350 MG/ML SOLN

[Series 5: pe 1.0 thins · axial · 0.75mm/px · z∈[-348,-60]mm · 18 of 323 slices shown]
[im 18/323  lung]
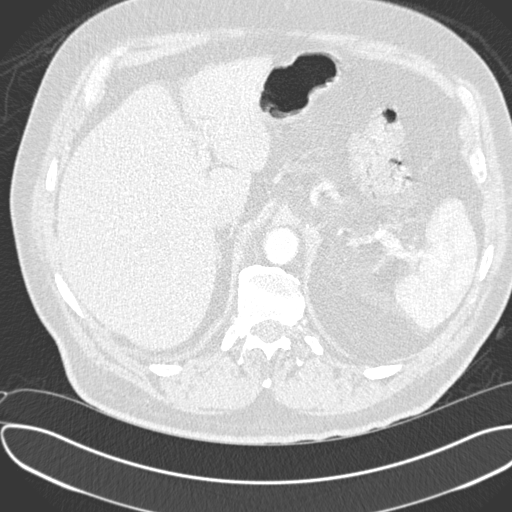
[im 36/323  mediastinal]
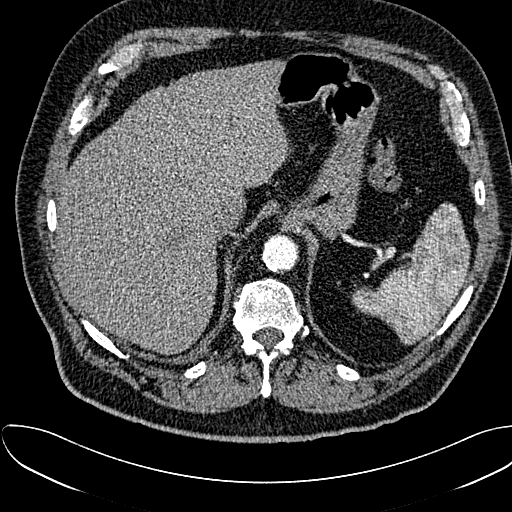
[im 54/323  lung]
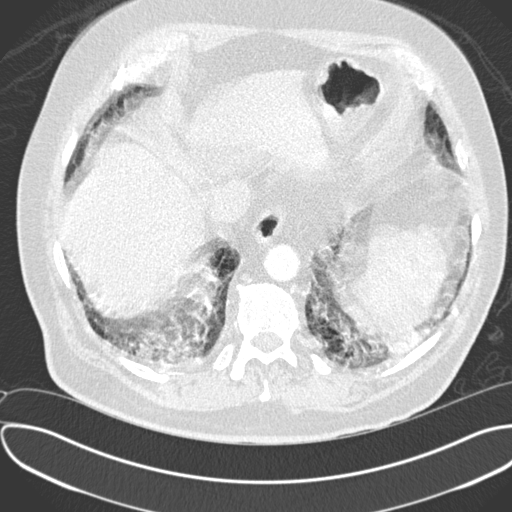
[im 72/323  mediastinal]
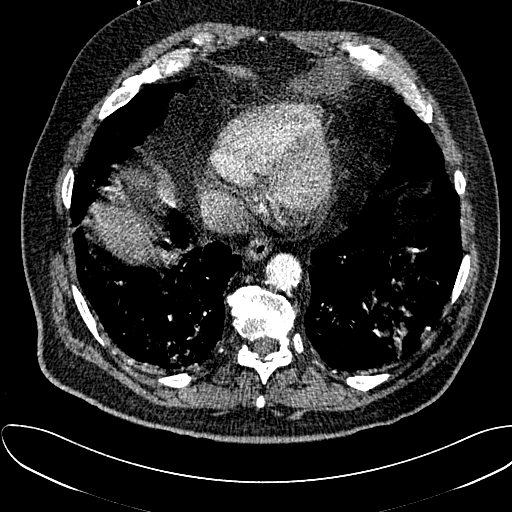
[im 90/323  lung]
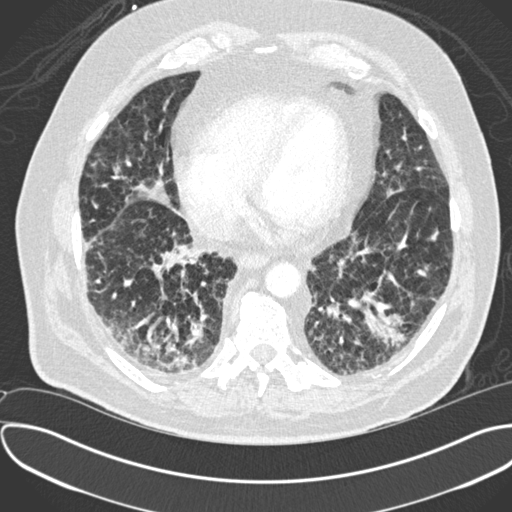
[im 108/323  mediastinal]
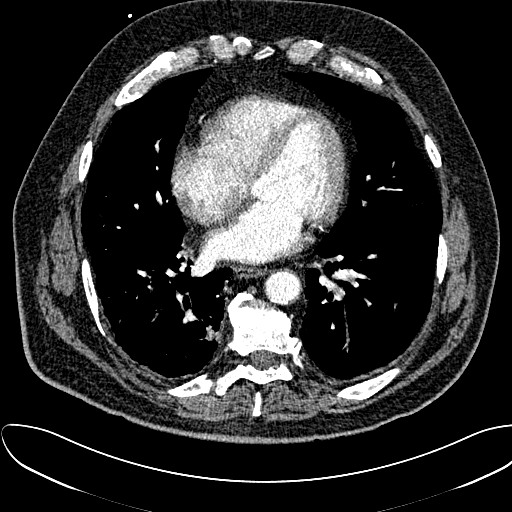
[im 126/323  lung]
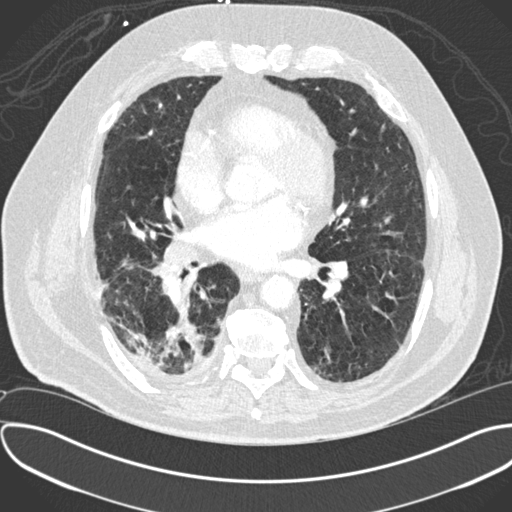
[im 144/323  mediastinal]
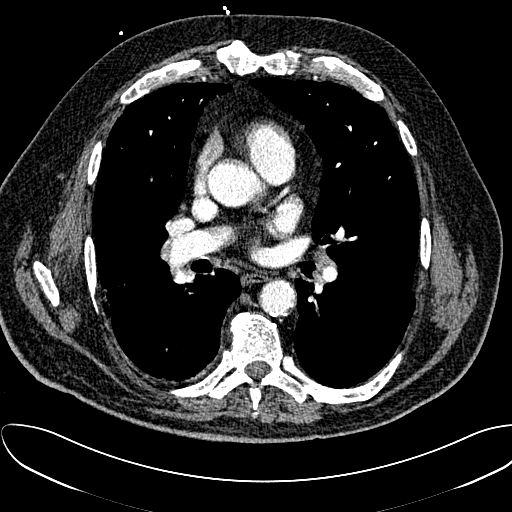
[im 152/323  lung]
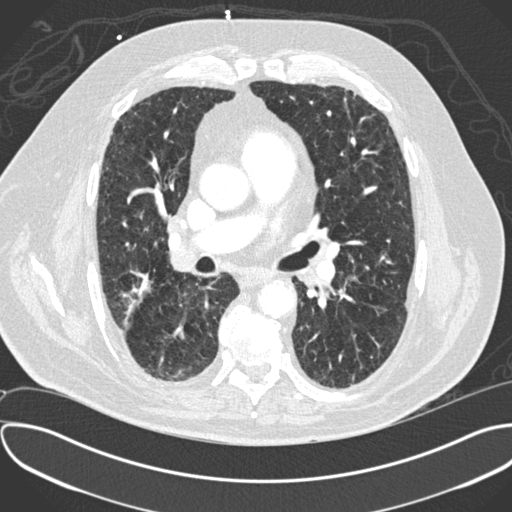
[im 162/323  mediastinal]
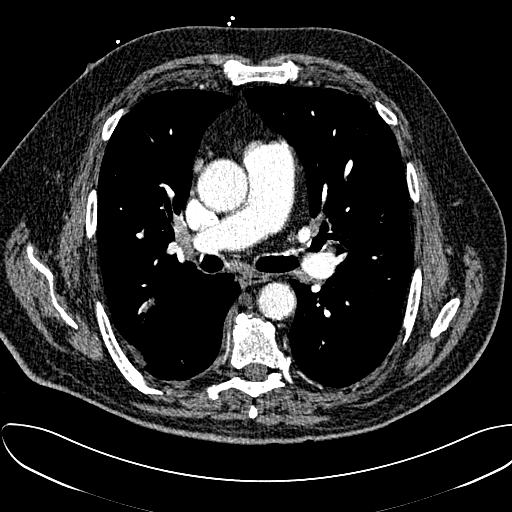
[im 179/323  lung]
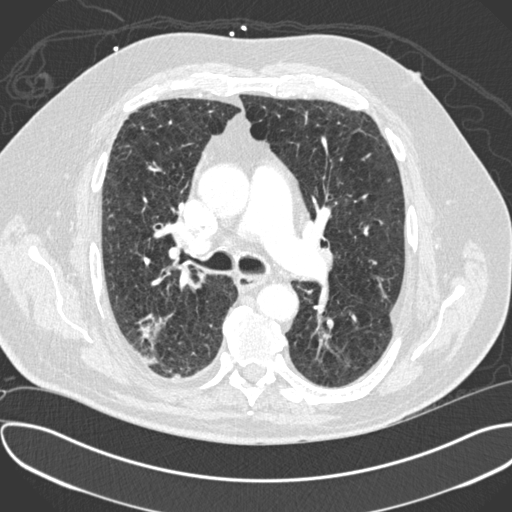
[im 197/323  mediastinal]
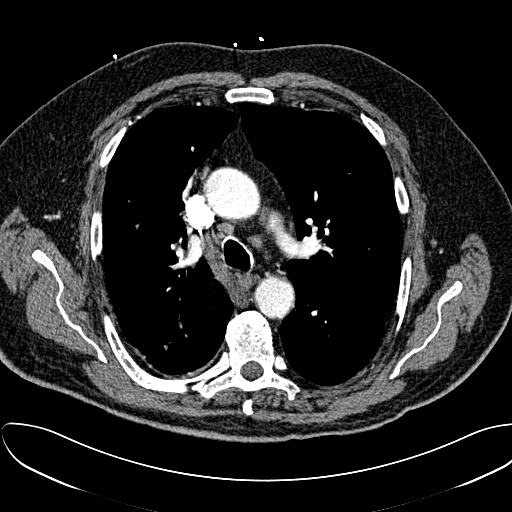
[im 215/323  lung]
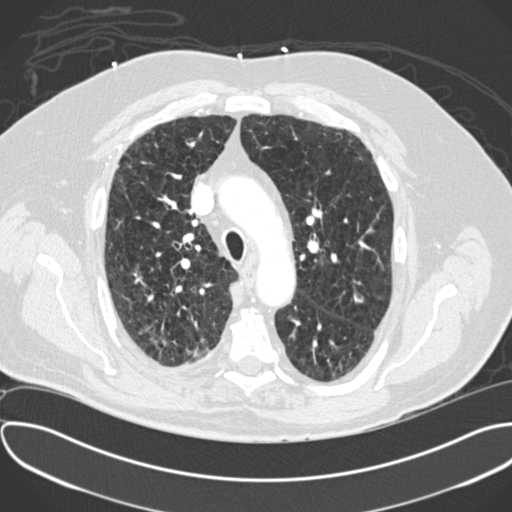
[im 233/323  mediastinal]
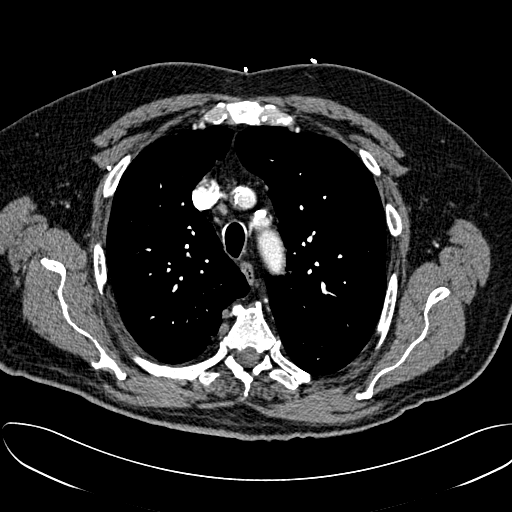
[im 251/323  lung]
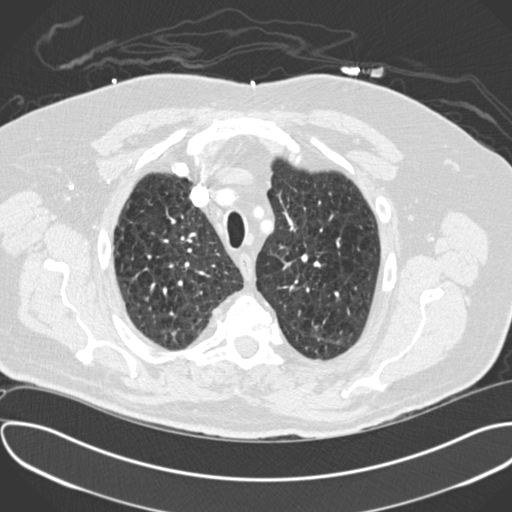
[im 269/323  mediastinal]
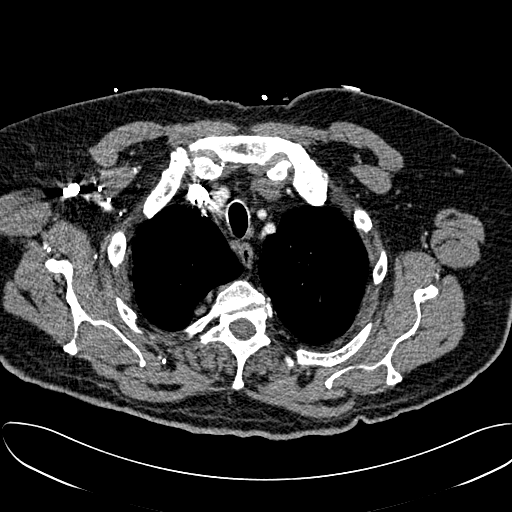
[im 287/323  lung]
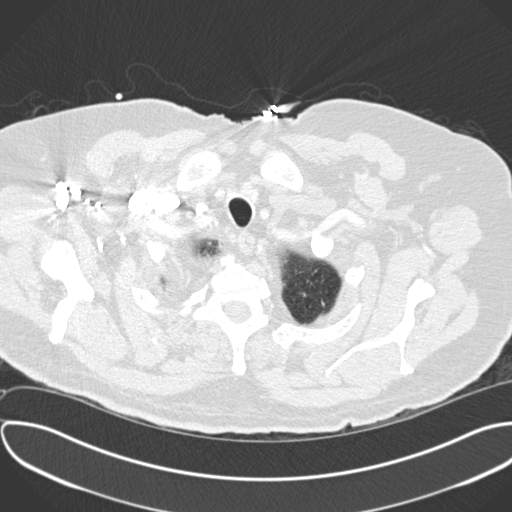
[im 305/323  mediastinal]
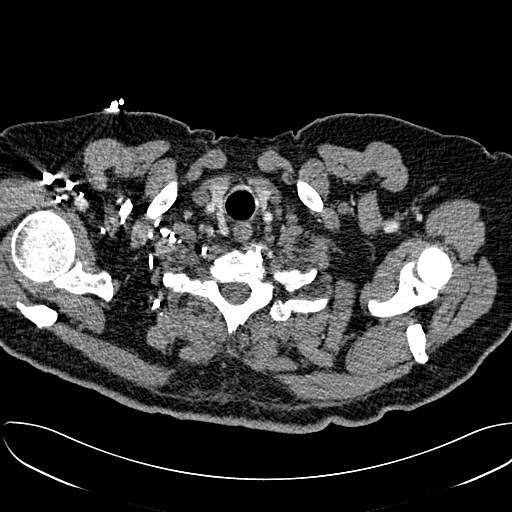

[18 of 30 positions shown; findings below may reference images not displayed]

FINDINGS: Cardiovascular: There is good opacification of the pulmonary
arteries. There is no pulmonary embolism. The thoracic aorta is
normal in caliber and intact.

Lungs: There are patchy airspace opacities in the lower lobes
bilaterally, and to a lesser extent in the posterior right upper
lobe and in the posterior lingula and right middle lobe. This may
represent multifocal pneumonia. This is superimposed on severe
emphysematous changes.

Central airways: Patent

Effusions: None

Lymphadenopathy: Nonspecific hilar adenopathy, right greater than
left, possibly reactive.

Esophagus: Unremarkable

Upper abdomen: Small hiatal hernia.

Musculoskeletal: No significant abnormality

Review of the MIP images confirms the above findings.
IMPRESSION: 1. Negative for acute pulmonary embolism.
2. Multifocal patchy airspace opacities bilaterally, consistent with
pneumonia. Severe emphysematous changes.
